# Patient Record
Sex: Male | Born: 1985 | Race: White | Hispanic: No | Marital: Single | Smoking: Never smoker
Health system: Southern US, Community
[De-identification: ages and names within clinical notes are randomized; demographics above are authoritative.]

## PROBLEM LIST (undated history)

## (undated) DIAGNOSIS — R569 Unspecified convulsions: Secondary | ICD-10-CM

## (undated) DIAGNOSIS — K219 Gastro-esophageal reflux disease without esophagitis: Secondary | ICD-10-CM

## (undated) DIAGNOSIS — K439 Ventral hernia without obstruction or gangrene: Secondary | ICD-10-CM

## (undated) HISTORY — PX: APPENDECTOMY: SHX54

---

## 2005-11-24 ENCOUNTER — Emergency Department: Payer: Self-pay | Admitting: Emergency Medicine

## 2007-04-03 ENCOUNTER — Ambulatory Visit: Payer: Self-pay | Admitting: Internal Medicine

## 2009-01-24 ENCOUNTER — Ambulatory Visit: Payer: Self-pay | Admitting: Family Medicine

## 2009-01-24 ENCOUNTER — Encounter (INDEPENDENT_AMBULATORY_CARE_PROVIDER_SITE_OTHER): Payer: Self-pay | Admitting: *Deleted

## 2009-04-07 ENCOUNTER — Ambulatory Visit: Payer: Self-pay | Admitting: Family Medicine

## 2009-05-18 ENCOUNTER — Inpatient Hospital Stay: Payer: Self-pay | Admitting: Vascular Surgery

## 2009-05-29 ENCOUNTER — Encounter: Payer: Self-pay | Admitting: Internal Medicine

## 2010-04-28 NOTE — Letter (Signed)
Summary: [facility]  [facility]   Imported By: Lanelle Bal 06/12/2009 08:42:26  _____________________________________________________________________  External Attachment:    Type:   Image     Comment:   External Document  Appended Document: [facility]    Clinical Lists Changes  Observations: Added new observation of PAST SURG HX: 1988 Bacterial meningitis 2/11 Laparoscopic appendectomy and drainage of abscess --Dr Wyn Quaker (06/12/2009 13:31)       Past History:  Past Surgical History: 1988 Bacterial meningitis 2/11 Laparoscopic appendectomy and drainage of abscess --Dr Wyn Quaker

## 2010-04-28 NOTE — Assessment & Plan Note (Signed)
Summary: SINUS INFECTION/[facility]   Vital Signs:  Patient profile:   25 year old male Weight:      187 pounds Temp:     97.5 degrees F oral Pulse rate:   72 / minute Pulse rhythm:   regular BP sitting:   110 / 80  (left arm) Cuff size:   regular  Vitals Entered By: Lowella Petties CMA (April 07, 2009 2:00 PM) CC: Congestion, sinus pressure   History of Present Illness: is sick with uri symptoms  started with a sore throat -- and thick feeling -- that was early this month  then developed nasal congestion and sinus pain  this am bad pressureon L side of face and around eye  ears are ok   cough- but not productive   no otc meds   missed work today-needs a note     Allergies: No Known Drug Allergies  Past History:  Past Medical History: Last updated: 04/03/2007 Absence seizures--off meds 2002 ADHD  Past Surgical History: Last updated: 04/03/2007 1988 Bacterial meningitis  Family History: Last updated: 04/03/2007 Doesn't know Dad Mom is health 1 brother, 1 sister CAD in mat GM Some HTN, DM in family  Social History: Last updated: 04/03/2007 Occupation: [employer] at [employer] Single Never Smoked Alcohol use-yes  Risk Factors: Smoking Status: never (04/03/2007)  Review of Systems General:  Complains of fatigue; denies chills, fever, loss of appetite, and malaise. Eyes:  Denies blurring and eye irritation. ENT:  Complains of hoarseness, nasal congestion, postnasal drainage, sinus pressure, and sore throat; denies ear discharge and earache. CV:  Denies chest pain or discomfort and palpitations. Resp:  Complains of cough; denies shortness of breath and wheezing. GI:  Denies abdominal pain, nausea, and vomiting. Derm:  Denies rash. Neuro:  Complains of headaches.  Physical Exam  General:  fatigued but well appearing  Head:  normocephalic, atraumatic, and no abnormalities observed.  sinus pain L maxillary- tender Eyes:  vision grossly intact, pupils  equal, pupils round, and pupils reactive to light.  mild conj injection Ears:  R ear normal and L ear normal.   Nose:  nares are congested and injected bilat  Mouth:  pharynx pink and moist, no erythema, and no exudates.   Neck:  No deformities, masses, or tenderness noted. Lungs:  Normal respiratory effort, chest expands symmetrically. Lungs are clear to auscultation, no crackles or wheezes. Heart:  Normal rate and regular rhythm. S1 and S2 normal without gallop, murmur, click, rub or other extra sounds. Skin:  Intact without suspicious lesions or rashes Cervical Nodes:  No lymphadenopathy noted Psych:  normal affect, talkative and pleasant    Impression & Recommendations:  Problem # 1:  SINUSITIS - ACUTE-NOS (ICD-461.9) Assessment New with congestion - for almost 2 weeks after uri will cover with augmentin and update  recommend sympt care- see pt instructions   pt advised to update me if symptoms worsen or do not improve - esp sinus pain or cough or any fever  His updated medication list for this problem includes:    Augmentin 875-125 Mg Tabs (Amoxicillin-pot clavulanate) .Marland Kitchen... 1 by mouth two times a day for 10 days for sinus infection  Complete Medication List: 1)  Augmentin 875-125 Mg Tabs (Amoxicillin-pot clavulanate) .Marland Kitchen.. 1 by mouth two times a day for 10 days for sinus infection  Patient Instructions: 1)  aleve ok for headache- take with food  2)  drink lots of water 3)  mucinex and nasal saline spray- can help with  congestion  4)  take the augmentin for sinus infection as directed  5)  if not improved in 1 week or worse- please update me  Prescriptions: AUGMENTIN 875-125 MG TABS (AMOXICILLIN-POT CLAVULANATE) 1 by mouth two times a day for 10 days for sinus infection  #20 x 0   Entered and Authorized by:   Judith Part MD   Signed by:   Judith Part MD on 04/07/2009   Method used:   Print then Give to Patient   RxID:   [id number]   Prior  Medications: Current Allergies: No known allergies

## 2010-04-28 NOTE — Letter (Signed)
Summary: Out of Work  [facility] at [facility]  [address] [place], [place] [address]   Phone: [phone]  Fax: [phone]    April 07, 2009   Employee:  TRACEY STEWART    To Whom It May Concern:   For Medical reasons, please excuse the above named employee from work for the following dates:  Start:   Apr 07, 2009   End:   Apr 09, 2009 if he is feeling better   If you need additional information, please feel free to contact our office.         Sincerely,    Judith Part MD

## 2010-05-15 ENCOUNTER — Ambulatory Visit (INDEPENDENT_AMBULATORY_CARE_PROVIDER_SITE_OTHER): Payer: 59 | Admitting: Family Medicine

## 2010-05-15 ENCOUNTER — Encounter (INDEPENDENT_AMBULATORY_CARE_PROVIDER_SITE_OTHER): Payer: Self-pay | Admitting: *Deleted

## 2010-05-15 ENCOUNTER — Encounter: Payer: Self-pay | Admitting: Family Medicine

## 2010-05-15 DIAGNOSIS — J029 Acute pharyngitis, unspecified: Secondary | ICD-10-CM

## 2010-05-15 DIAGNOSIS — A088 Other specified intestinal infections: Secondary | ICD-10-CM

## 2010-05-15 LAB — CONVERTED CEMR LAB: Rapid Strep: NEGATIVE

## 2010-05-20 NOTE — Assessment & Plan Note (Signed)
Summary: FEVER,ST,THROWING UP/CLE   [facility]   Vital Signs:  Patient profile:   25 year old male Weight:      172.25 pounds BMI:     23.45 Temp:     98.7 degrees F oral Pulse rate:   84 / minute Pulse rhythm:   regular BP sitting:   110 / 70  (left arm) Cuff size:   regular  Vitals Entered By: Selena Batten Dance CMA (AAMA) (May 15, 2010 10:10 AM) CC: Fever,sore throat,vomitting   History of Present Illness: CC: fever, ST, vomiting  1 d h/o low grade fever to 99.8, body aches, sweating.  Started when he came home from work yesterday.  Also vomiting since yesterday.  No diarrhea.  ST started today (after vomiting).  Emesis NBNB.  No sick contacts that patient knows.  No new foods.  Mild headache, thinks staying well hydrated.    Parents smoke, pt denies EtOH, no rec drugs.  h/o bacterial meningitis as child.  no current neck stiffnes.  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  Eyes:  vision grossly intact, pupils equal, pupils round, and pupils reactive to light.  Ears:  R ear normal and L ear normal.   Nose:  nares clear bilaterally Mouth:  pharynx pink and moist  + erythema, some thin white exudate L tonsil Neck:  No deformities, masses, or tenderness noted.  no LAD Lungs:  Normal respiratory effort, chest expands symmetrically. Lungs are clear to auscultation, no crackles or wheezes. Heart:  Normal rate and  regular rhythm. S1 and S2 normal without gallop, murmur, click, rub or other extra sounds. Abdomen:  Bowel sounds positive,abdomen soft and non-tender without masses, organomegaly or hernias noted. Pulses:  2+ rad pulses, brisk cap refill Extremities:  no pedal edema Skin:  Intact without suspicious lesions or rashes   Impression & Recommendations:  Problem # 1:  VIRAL GASTROENTERITIS (ICD-008.8) Assessment New in setting of stomach flu going around.  seems to be improving alone.  discused anticipated course of recovery.  given ST, checked rapid strep - negative.  pt declines antinausea med.  advised update [place] if not improving as expected.  supportive care, push fluids.  Problem # 2:  SORE THROAT (ICD-462) likely due to vomiting last night. The following medications were removed from the medication list:    Augmentin 875-125 Mg Tabs (Amoxicillin-pot clavulanate) .Marland Kitchen... 1 by mouth two times a day for 10 days for sinus infection  Orders: Rapid Strep ([address])  Patient Instructions: 1)  Swabbed your throat today to ensure not strep. 2)  Sounds like viral gastroenteritis.  treat with pushing fluids and plenty of rest.   3)  Tyelnol better than ibuprofen/motrin while you're feeling poorly.   4)  If fever >101.5 or not improving as expected (in 24 -48 hours), let us know.  5)  If feeling worse, please get checked out over weekend. 6)  Good to see you today.   Orders Added: 1)  Rapid Strep [87880] 2)  Est. Patient Level III [04540]    Prior Medications: Current Allergies (reviewed today): No known allergies   Laboratory Results  Date/Time Received: May 15, 2010 11:13 AM  Date/Time Reported: May 15, 2010 11:14 AM   Other Tests  Rapid Strep: negative

## 2010-05-20 NOTE — Letter (Signed)
Summary: Out of Work  [facility] at [facility]  [address] [place], [place] [address]   Phone: [phone]  Fax: [phone]    May 15, 2010   Employee:  DMAURI ROSENOW    To Whom It May Concern:   For Medical reasons, please excuse the above named employee from work for the following dates:  Start:  May 15, 2010   End:  May 16, 2010 (May return on the 19th)  If you need additional information, please feel free to contact our office.           Sincerely,      Selena Batten Dance CMA (AAMA)

## 2018-11-07 ENCOUNTER — Inpatient Hospital Stay
Admission: EM | Admit: 2018-11-07 | Discharge: 2018-11-10 | DRG: 419 | Disposition: A | Discharge status: Home / Self Care | Payer: Self-pay | Attending: Surgery | Admitting: Surgery

## 2018-11-07 ENCOUNTER — Encounter: Payer: Self-pay | Admitting: Emergency Medicine

## 2018-11-07 ENCOUNTER — Other Ambulatory Visit: Payer: Self-pay

## 2018-11-07 ENCOUNTER — Emergency Department: Payer: Self-pay

## 2018-11-07 DIAGNOSIS — K660 Peritoneal adhesions (postprocedural) (postinfection): Secondary | ICD-10-CM | POA: Diagnosis present

## 2018-11-07 DIAGNOSIS — K8000 Calculus of gallbladder with acute cholecystitis without obstruction: Principal | ICD-10-CM | POA: Diagnosis present

## 2018-11-07 DIAGNOSIS — Z20828 Contact with and (suspected) exposure to other viral communicable diseases: Secondary | ICD-10-CM | POA: Diagnosis present

## 2018-11-07 DIAGNOSIS — K82A1 Gangrene of gallbladder in cholecystitis: Secondary | ICD-10-CM | POA: Diagnosis present

## 2018-11-07 DIAGNOSIS — Z6833 Body mass index (BMI) 33.0-33.9, adult: Secondary | ICD-10-CM

## 2018-11-07 DIAGNOSIS — R1011 Right upper quadrant pain: Secondary | ICD-10-CM

## 2018-11-07 DIAGNOSIS — K810 Acute cholecystitis: Secondary | ICD-10-CM | POA: Diagnosis present

## 2018-11-07 DIAGNOSIS — K828 Other specified diseases of gallbladder: Secondary | ICD-10-CM | POA: Diagnosis present

## 2018-11-07 DIAGNOSIS — K219 Gastro-esophageal reflux disease without esophagitis: Secondary | ICD-10-CM | POA: Diagnosis present

## 2018-11-07 HISTORY — DX: Gastro-esophageal reflux disease without esophagitis: K21.9

## 2018-11-07 LAB — COMPREHENSIVE METABOLIC PANEL
ALT: 136 U/L — ABNORMAL HIGH (ref 0–44)
AST: 59 U/L — ABNORMAL HIGH (ref 15–41)
Albumin: 4.6 g/dL (ref 3.5–5.0)
Alkaline Phosphatase: 95 U/L (ref 38–126)
Anion gap: 10 (ref 5–15)
BUN: 12 mg/dL (ref 6–20)
CO2: 24 mmol/L (ref 22–32)
Calcium: 9.2 mg/dL (ref 8.9–10.3)
Chloride: 101 mmol/L (ref 98–111)
Creatinine, Ser: 0.98 mg/dL (ref 0.61–1.24)
GFR calc Af Amer: >60 mL/min (ref >60)
GFR calc non Af Amer: >60 mL/min (ref >60)
Glucose, Bld: 110 mg/dL — ABNORMAL HIGH (ref 70–99)
Potassium: 3.7 mmol/L (ref 3.5–5.1)
Sodium: 135 mmol/L (ref 135–145)
Total Bilirubin: 1.3 mg/dL — ABNORMAL HIGH (ref 0.3–1.2)
Total Protein: 8.4 g/dL — ABNORMAL HIGH (ref 6.5–8.1)

## 2018-11-07 LAB — LIPASE, BLOOD: Lipase: 25 U/L (ref 11–51)

## 2018-11-07 LAB — CBC
HCT: 46.5 % (ref 39.0–52.0)
Hemoglobin: 16.3 g/dL (ref 13.0–17.0)
MCH: 30.1 pg (ref 26.0–34.0)
MCHC: 35.1 g/dL (ref 30.0–36.0)
MCV: 85.8 fL (ref 80.0–100.0)
Platelets: 317 10*3/uL (ref 150–400)
RBC: 5.42 MIL/uL (ref 4.22–5.81)
RDW: 13.4 % (ref 11.5–15.5)
WBC: 12.6 10*3/uL — ABNORMAL HIGH (ref 4.0–10.5)
nRBC: 0 % (ref 0.0–0.2)

## 2018-11-07 LAB — SARS CORONAVIRUS 2 BY RT PCR (HOSPITAL ORDER, PERFORMED IN ~~LOC~~ HOSPITAL LAB): SARS Coronavirus 2: NEGATIVE

## 2018-11-07 MED ORDER — SODIUM CHLORIDE 0.9 % IV BOLUS
1000.0000 mL | Freq: Once | INTRAVENOUS | Status: AC
  Administered 2018-11-07: 15:00:00 1000 mL via INTRAVENOUS

## 2018-11-07 MED ORDER — LACTATED RINGERS IV SOLN
INTRAVENOUS | Status: DC
  Administered 2018-11-07 – 2018-11-09 (×5): via INTRAVENOUS

## 2018-11-07 MED ORDER — ONDANSETRON HCL 4 MG/2ML IJ SOLN: 4.0000 mg | Freq: Four times a day (QID) | INTRAMUSCULAR | Status: DC | PRN

## 2018-11-07 MED ORDER — ONDANSETRON HCL 4 MG/2ML IJ SOLN: 4.0000 mg | Freq: Once | INTRAMUSCULAR | Status: DC

## 2018-11-07 MED ORDER — ONDANSETRON 4 MG PO TBDP: 4.0000 mg | ORAL_TABLET | Freq: Four times a day (QID) | ORAL | Status: DC | PRN

## 2018-11-07 MED ORDER — LIDOCAINE VISCOUS HCL 2 % MT SOLN
15.0000 mL | Freq: Once | OROMUCOSAL | Status: AC
  Administered 2018-11-07: 15 mL via ORAL
  Filled 2018-11-07: qty 15

## 2018-11-07 MED ORDER — SODIUM CHLORIDE 0.9 % IV SOLN
2.0000 g | INTRAVENOUS | Status: DC
  Administered 2018-11-07 – 2018-11-09 (×3): 2 g via INTRAVENOUS
  Filled 2018-11-07 (×2): qty 20
  Filled 2018-11-07 (×3): qty 2

## 2018-11-07 MED ORDER — MORPHINE SULFATE (PF) 2 MG/ML IV SOLN
2.0000 mg | INTRAVENOUS | Status: DC | PRN
  Administered 2018-11-08: 15:00:00 2 mg via INTRAVENOUS
  Filled 2018-11-07: qty 1

## 2018-11-07 MED ORDER — SODIUM CHLORIDE 0.9 % IV SOLN
INTRAVENOUS | Status: DC | PRN
  Administered 2018-11-07: 21:00:00 250 mL via INTRAVENOUS

## 2018-11-07 MED ORDER — TRAMADOL HCL 50 MG PO TABS
50.0000 mg | ORAL_TABLET | Freq: Four times a day (QID) | ORAL | Status: DC | PRN
  Administered 2018-11-10: 14:00:00 50 mg via ORAL
  Filled 2018-11-07: qty 1

## 2018-11-07 MED ORDER — DOCUSATE SODIUM 100 MG PO CAPS: 100.0000 mg | ORAL_CAPSULE | Freq: Two times a day (BID) | ORAL | Status: DC | PRN

## 2018-11-07 MED ORDER — ONDANSETRON HCL 4 MG/2ML IJ SOLN
4.0000 mg | Freq: Once | INTRAMUSCULAR | Status: AC
  Administered 2018-11-07: 15:00:00 4 mg via INTRAVENOUS
  Filled 2018-11-07: qty 2

## 2018-11-07 MED ORDER — MORPHINE SULFATE (PF) 2 MG/ML IV SOLN
2.0000 mg | Freq: Once | INTRAVENOUS | Status: AC
  Administered 2018-11-07: 15:00:00 2 mg via INTRAVENOUS
  Filled 2018-11-07: qty 1

## 2018-11-07 MED ORDER — HYDROCODONE-ACETAMINOPHEN 5-325 MG PO TABS
1.0000 | ORAL_TABLET | ORAL | Status: DC | PRN
  Administered 2018-11-07: 20:00:00 2 via ORAL
  Administered 2018-11-09: 17:00:00 1 via ORAL
  Administered 2018-11-10 (×3): 2 via ORAL
  Filled 2018-11-07 (×4): qty 2
  Filled 2018-11-07: qty 1

## 2018-11-07 MED ORDER — ALUM & MAG HYDROXIDE-SIMETH 200-200-20 MG/5ML PO SUSP
30.0000 mL | Freq: Once | ORAL | Status: AC
  Administered 2018-11-07: 30 mL via ORAL
  Filled 2018-11-07: qty 30

## 2018-11-07 NOTE — ED Triage Notes (Signed)
Pt sent over from [facility], reports epigastric pain x 2 days w/ N/V, denies diarrhea.  Hx GERD.  NAD noted at this time.

## 2018-11-07 NOTE — ED Notes (Signed)
Floor made aware that pt was on the way

## 2018-11-07 NOTE — ED Provider Notes (Signed)
[facility] Emergency Department Provider Note  ____________________________________________   First MD Initiated Contact with Patient 11/07/18 1753     (approximate)  I have reviewed the triage vital signs and the nursing notes.   HISTORY  Chief Complaint Abdominal Pain    HPI Kristopher Allen is a 33 y.o. male with previous history of gastroesophageal reflux disease presents to the emergency department secondary to intermittent epigastric/upper abdominal pain x2 days with associated nausea.  Patient denies any fever.  Patient denies any diarrhea constipation.  Patient denies any urinary symptoms.       Past Medical History:  Diagnosis Date  . GERD (gastroesophageal reflux disease)       Past Surgical History:  Procedure Laterality Date  . APPENDECTOMY      Prior to Admission medications   Not on File    Allergies Patient has no known allergies.  No family history on file.  Social History Social History   Tobacco Use  . Smoking status: Never Smoker  . Smokeless tobacco: Never Used  Substance Use Topics  . Alcohol use: Never    Frequency: Never  . Drug use: Never    Review of Systems Constitutional: No fever/chills Eyes: No visual changes. ENT: No sore throat. Cardiovascular: Denies chest pain. Respiratory: Denies shortness of breath. Gastrointestinal: Positive for abdominal pain.  No nausea, no vomiting.  No diarrhea.  No constipation. Genitourinary: Negative for dysuria. Musculoskeletal: Negative for neck pain.  Negative for back pain. Integumentary: Negative for rash. Neurological: Negative for headaches, focal weakness or numbness.   ____________________________________________   PHYSICAL EXAM:  VITAL SIGNS: ED Triage Vitals  Enc Vitals Group     BP 11/07/18 1419 118/73     Pulse Rate 11/07/18 1419 85     Resp 11/07/18 1419 20     Temp 11/07/18 1419 98.7 F (37.1 C)     Temp Source 11/07/18 1419 Oral     SpO2 11/07/18 1419 97 %     Weight 11/07/18 1419 113.4 kg (250 lb)     Height 11/07/18 1419 1.829 m (6')     Head Circumference --      Peak Flow --      Pain Score 11/07/18 1430 4     Pain Loc --      Pain Edu? --      Excl. in GC? --     Constitutional: Alert and oriented.  Eyes: Conjunctivae are normal.  Mouth/Throat: Mucous membranes are moist. Neck: No stridor.  No meningeal signs.   Cardiovascular: Normal rate, regular rhythm. Good peripheral circulation. Grossly normal heart sounds. Respiratory: Normal respiratory effort.  No retractions. Gastrointestinal: Right upper quadrant tenderness to palpation. No distention.  Musculoskeletal: No lower extremity tenderness nor edema. No gross deformities of extremities. Neurologic:  Normal speech and language. No gross focal neurologic deficits are appreciated.  Skin:  Skin is warm, dry and intact. Psychiatric: Mood and affect are normal. Speech and behavior are normal.  ____________________________________________   LABS (all labs ordered are listed, but only abnormal results are displayed)  Labs Reviewed  COMPREHENSIVE METABOLIC PANEL - Abnormal; Notable for the following components:      Result Value   Glucose, Bld 110 (*)    Total Protein 8.4 (*)    AST 59 (*)    ALT 136 (*)    Total Bilirubin 1.3 (*)    All other components within normal limits  CBC - Abnormal; Notable for the following components:  WBC 12.6 (*)    All other components within normal limits  SARS CORONAVIRUS 2 (HOSPITAL ORDER, [place] [facility])  SURGICAL PCR SCREEN  LIPASE, BLOOD  URINALYSIS, COMPLETE (UACMP) WITH MICROSCOPIC  HIV ANTIBODY (ROUTINE TESTING W REFLEX)  BASIC METABOLIC PANEL  MAGNESIUM  PHOSPHORUS  CBC  HEPATIC FUNCTION PANEL   ____________________________________________  EKG  ED ECG REPORT I, [place] N Nashalie Sallis, the attending physician, personally viewed and interpreted this ECG.   Date: 11/07/2018  EKG  Time: 2:17 PM  Rate: 74  Rhythm: Normal sinus rhythm  Axis: Normal  Intervals: Normal  ST&T Change: None  ____________________________________________  RADIOLOGY I, Monserrate N Briar Sword, personally viewed and evaluated these images (plain radiographs) as part of my medical decision making, as well as reviewing the written report by the radiologist.  ED MD interpretation: Cholelithiasis with gallbladder wall thickening pericholecystic fluid concerning for acute cholecystitis.  Official radiology report(s): US Abdomen Limited Ruq  Result Date: 11/07/2018 CLINICAL DATA:  Upper abdominal pain EXAM: ULTRASOUND ABDOMEN LIMITED RIGHT UPPER QUADRANT COMPARISON:  None. FINDINGS: Gallbladder: Within the gallbladder, there is either one large or several smaller adherent gallstones measuring 2.7 cm in length. There is also sludge in the gallbladder. The gallbladder wall is thickened and slightly edematous in appearance with mild pericholecystic fluid. No sonographic Murphy sign noted by sonographer. Common bile duct: Diameter: 4 mm. No intrahepatic or extrahepatic biliary duct dilatation. Liver: No focal lesion identified. Liver echogenicity is increased diffusely. Portal vein is patent on color Doppler imaging with normal direction of blood flow towards the liver. Other: None. IMPRESSION: 1. Cholelithiasis with gallbladder wall thickening and pericholecystic fluid. These are findings felt to be indicative of acute cholecystitis. 2. Diffuse increase in liver echogenicity, a finding felt to be indicative of hepatic steatosis. While no focal liver lesions are evident on this study, it must be cautioned that the sensitivity of ultrasound for detection of focal liver lesions is diminished in this circumstance. Electronically Signed   By: Lowella Grip III M.D.   On: 11/07/2018 16:19    Procedures   ____________________________________________   INITIAL IMPRESSION / MDM / ASSESSMENT AND PLAN / ED COURSE   As part of my medical decision making, I reviewed the following data within the electronic MEDICAL RECORD NUMBER  33 year old male presented with above-stated history and physical exam concerning for cholelithiasis/cholecystitis and as such right upper quadrant ultrasound performed which revealed evidence of cholecystitis and cholelithiasis.  Patient was given IV morphine in the emergency department improvement of pain.  Patient's laboratory data notable for bilirubin of 1.3 AST and ALT 59 and 136 respectively.  Patient discussed with Dr. Lysle Pearl general surgery who admitted the patient for further evaluation and management.       ____________________________________________  FINAL CLINICAL IMPRESSION(S) / ED DIAGNOSES  Final diagnoses:  RUQ pain  Acute cholecystitis     MEDICATIONS GIVEN DURING THIS VISIT:  Medications  traMADol (ULTRAM) tablet 50 mg (has no administration in time range)  HYDROcodone-acetaminophen (NORCO/VICODIN) 5-325 MG per tablet 1-2 tablet (has no administration in time range)  morphine 2 MG/ML injection 2 mg (has no administration in time range)  docusate sodium (COLACE) capsule 100 mg (has no administration in time range)  ondansetron (ZOFRAN-ODT) disintegrating tablet 4 mg (has no administration in time range)    Or  ondansetron (ZOFRAN) injection 4 mg (has no administration in time range)  cefTRIAXone (ROCEPHIN) 2 g in sodium chloride 0.9 % 100 mL IVPB (has no administration  in time range)  lactated ringers infusion (has no administration in time range)  ondansetron (ZOFRAN) injection 4 mg (4 mg Intravenous Given 11/07/18 1525)  sodium chloride 0.9 % bolus 1,000 mL (0 mLs Intravenous Stopped 11/07/18 1738)  morphine 2 MG/ML injection 2 mg (2 mg Intravenous Given 11/07/18 1528)  alum & mag hydroxide-simeth (MAALOX/MYLANTA) 200-200-20 MG/5ML suspension 30 mL (30 mLs Oral Given 11/07/18 1532)    And  lidocaine (XYLOCAINE) 2 % viscous mouth solution 15 mL (15 mLs Oral  Given 11/07/18 1532)     ED Discharge Orders    None      *Please note:  Kristopher Allen was evaluated in Emergency Department on 11/07/2018 for the symptoms described in the history of present illness. He was evaluated in the context of the global COVID-19 pandemic, which necessitated consideration that the patient might be at risk for infection with the SARS-CoV-2 virus that causes COVID-19. Institutional protocols and algorithms that pertain to the evaluation of patients at risk for COVID-19 are in a state of rapid change based on information released by regulatory bodies including the CDC and federal and state organizations. These policies and algorithms were followed during the patient's care in the ED.  Some ED evaluations and interventions may be delayed as a result of limited staffing during the pandemic.*  Note:  This document was prepared using Dragon voice recognition software and may include unintentional dictation errors.   Darci CurrentBrown, [place] N, MD 11/07/18 2023

## 2018-11-07 NOTE — ED Notes (Signed)
Sent green and purple top tubes to lab. 

## 2018-11-07 NOTE — ED Notes (Signed)
First Nurse Note; Pt brought over via [facility] due to intermittent chest and abdominal pain x 2 days.  NAD noted at this time.

## 2018-11-07 NOTE — H&P (Signed)
Subjective:   CC: Acute cholecystitis  HPI:  Kristopher Allen is a 33 y.o. male who is consulted by Manson PasseyBrown for evaluation of above cc.  Symptoms were first noted 2 days ago. Pain is crampy, intermittent, located in RUQ.  Associated with N/V, exacerbated by nothing specfiic     Past Medical History:  has a past medical history of GERD (gastroesophageal reflux disease).  Past Surgical History:  has a past surgical history that includes Appendectomy.  Family History: Reviewed and not relevant to chief complaint  Social History:  reports that he has never smoked. He has never used smokeless tobacco. He reports that he does not drink alcohol or use drugs.  Current Medications: None reported  Allergies:  Allergies as of 11/07/2018  . (No Known Allergies)    ROS:  General: Denies weight loss, weight gain, fatigue, fevers, chills, and night sweats. Eyes: Denies blurry vision, double vision, eye pain, itchy eyes, and tearing. Ears: Denies hearing loss, earache, and ringing in ears. Nose: Denies sinus pain, congestion, infections, runny nose, and nosebleeds. Mouth/throat: Denies hoarseness, sore throat, bleeding gums, and difficulty swallowing. Heart: Denies chest pain, palpitations, racing heart, irregular heartbeat, leg pain or swelling, and decreased activity tolerance. Respiratory: Denies breathing difficulty, shortness of breath, wheezing, cough, and sputum. GI: Denies  constipation, diarrhea, and blood in stool. GU: Denies difficulty urinating, pain with urinating, urgency, frequency, blood in urine. Musculoskeletal: Denies joint stiffness, pain, swelling, muscle weakness. Skin: Denies rash, itching, mass, tumors, sores, and boils Neurologic: Denies headache, fainting, dizziness, seizures, numbness, and tingling. Psychiatric: Denies depression, anxiety, difficulty sleeping, and memory loss. Endocrine: Denies heat or cold intolerance, and increased thirst or urination. Blood/lymph:  Denies easy bruising, easy bruising, and swollen glands     Objective:     BP 125/67 (BP Location: Left Arm)   Pulse 79   Temp 98.7 F (37.1 C) (Oral)   Resp 17   Ht 6' (1.829 m)   Wt 113.4 kg   SpO2 98%   BMI 33.91 kg/m    Constitutional :  alert, cooperative, appears stated age and no distress  Lymphatics/Throat:  no asymmetry, masses, or scars  Respiratory:  clear to auscultation bilaterally  Cardiovascular:  regular rate and rhythm  Gastrointestinal: soft, non-tender; bowel sounds normal; no masses,  no organomegaly.   Musculoskeletal: Steady movement  Skin: Cool and moist  Psychiatric: Normal affect, non-agitated, not confused       LABS:  No flowsheet data found. CBC Latest Ref Rng & Units 11/07/2018  WBC 4.0 - 10.5 K/uL 12.6(H)  Hemoglobin 13.0 - 17.0 g/dL 16.116.3  Hematocrit 09.639.0 - 52.0 % 46.5  Platelets 150 - 400 K/uL 317   CMP, lipase- pending  RADS: CLINICAL DATA:  Upper abdominal pain  EXAM: ULTRASOUND ABDOMEN LIMITED RIGHT UPPER QUADRANT  COMPARISON:  None.  FINDINGS: Gallbladder:  Within the gallbladder, there is either one large or several smaller adherent gallstones measuring 2.7 cm in length. There is also sludge in the gallbladder. The gallbladder wall is thickened and slightly edematous in appearance with mild pericholecystic fluid. No sonographic Murphy sign noted by sonographer.  Common bile duct:  Diameter: 4 mm. No intrahepatic or extrahepatic biliary duct dilatation.  Liver:  No focal lesion identified. Liver echogenicity is increased diffusely. Portal vein is patent on color Doppler imaging with normal direction of blood flow towards the liver.  Other: None.  IMPRESSION: 1. Cholelithiasis with gallbladder wall thickening and pericholecystic fluid. These are findings felt to be  indicative of acute cholecystitis.  2. Diffuse increase in liver echogenicity, a finding felt to be indicative of hepatic steatosis.  While no focal liver lesions are evident on this study, it must be cautioned that the sensitivity of ultrasound for detection of focal liver lesions is diminished in this circumstance.   Electronically Signed   By: Lowella Grip III M.D.   On: 11/07/2018 16:19 Assessment:      Acute cholecystitis, based on history and physical as well as ultrasound results.  Pending CMP to ensure no choledocholithiasis and/or pancreatitis.  Plan:      Discussed the risk of surgery including post-op infxn, seroma, biloma, chronic pain, poor-delayed wound healing, retained gallstone, conversion to open procedure, post-op SBO or ileus, and need for additional procedures to address said risks.  The risks of general anesthetic including MI, CVA, sudden death or even reaction to anesthetic medications also discussed. Alternatives include continued observation.  Benefits include possible symptom relief, prevention of complications including acute cholecystitis, pancreatitis.  Typical post operative recovery of 3-5 days rest, continued pain in area and incision sites, possible loose stools up to 4-6 weeks, also discussed.  The patient understands the risks, any and all questions were answered to the patient's satisfaction.  IV fluids, antibiotics, awaiting labs.  If labs within normal limits, likely will proceed with lap chole tomorrow.  Patient and mother verbalized understanding.

## 2018-11-08 LAB — BASIC METABOLIC PANEL
Anion gap: 7 (ref 5–15)
BUN: 13 mg/dL (ref 6–20)
CO2: 27 mmol/L (ref 22–32)
Calcium: 8.6 mg/dL — ABNORMAL LOW (ref 8.9–10.3)
Chloride: 102 mmol/L (ref 98–111)
Creatinine, Ser: 0.92 mg/dL (ref 0.61–1.24)
GFR calc Af Amer: >60 mL/min (ref >60)
GFR calc non Af Amer: >60 mL/min (ref >60)
Glucose, Bld: 86 mg/dL (ref 70–99)
Potassium: 3.7 mmol/L (ref 3.5–5.1)
Sodium: 136 mmol/L (ref 135–145)

## 2018-11-08 LAB — HEPATIC FUNCTION PANEL
ALT: 123 U/L — ABNORMAL HIGH (ref 0–44)
AST: 61 U/L — ABNORMAL HIGH (ref 15–41)
Albumin: 4 g/dL (ref 3.5–5.0)
Alkaline Phosphatase: 89 U/L (ref 38–126)
Bilirubin, Direct: 0.3 mg/dL — ABNORMAL HIGH (ref 0.0–0.2)
Indirect Bilirubin: 0.7 mg/dL (ref 0.3–0.9)
Total Bilirubin: 1 mg/dL (ref 0.3–1.2)
Total Protein: 7.4 g/dL (ref 6.5–8.1)

## 2018-11-08 LAB — CBC
HCT: 44.9 % (ref 39.0–52.0)
Hemoglobin: 15.3 g/dL (ref 13.0–17.0)
MCH: 30.1 pg (ref 26.0–34.0)
MCHC: 34.1 g/dL (ref 30.0–36.0)
MCV: 88.2 fL (ref 80.0–100.0)
Platelets: 240 10*3/uL (ref 150–400)
RBC: 5.09 MIL/uL (ref 4.22–5.81)
RDW: 13.2 % (ref 11.5–15.5)
WBC: 9.5 10*3/uL (ref 4.0–10.5)
nRBC: 0 % (ref 0.0–0.2)

## 2018-11-08 LAB — SURGICAL PCR SCREEN
MRSA, PCR: NEGATIVE
Staphylococcus aureus: POSITIVE — AB

## 2018-11-08 LAB — MAGNESIUM: Magnesium: 2 mg/dL (ref 1.7–2.4)

## 2018-11-08 LAB — PHOSPHORUS: Phosphorus: 3.2 mg/dL (ref 2.5–4.6)

## 2018-11-08 MED ORDER — MUPIROCIN 2 % EX OINT
1.0000 "application " | TOPICAL_OINTMENT | Freq: Two times a day (BID) | CUTANEOUS | Status: DC
  Administered 2018-11-08 – 2018-11-09 (×2): 1 via NASAL
  Filled 2018-11-08: qty 22

## 2018-11-08 MED ORDER — CHLORHEXIDINE GLUCONATE CLOTH 2 % EX PADS
6.0000 | MEDICATED_PAD | Freq: Every day | CUTANEOUS | Status: DC
  Administered 2018-11-08: 21:00:00 6 via TOPICAL

## 2018-11-09 ENCOUNTER — Inpatient Hospital Stay: Payer: Self-pay | Admitting: Anesthesiology

## 2018-11-09 ENCOUNTER — Encounter: Payer: Self-pay | Admitting: Surgery

## 2018-11-09 ENCOUNTER — Encounter
Admission: EM | Disposition: A | Discharge status: Home / Self Care | Payer: Self-pay | Source: Home / Self Care | Attending: Surgery

## 2018-11-09 HISTORY — PX: CHOLECYSTECTOMY: SHX55

## 2018-11-09 LAB — HEPATIC FUNCTION PANEL
ALT: 122 U/L — ABNORMAL HIGH (ref 0–44)
AST: 63 U/L — ABNORMAL HIGH (ref 15–41)
Albumin: 3.7 g/dL (ref 3.5–5.0)
Alkaline Phosphatase: 101 U/L (ref 38–126)
Bilirubin, Direct: 0.2 mg/dL (ref 0.0–0.2)
Indirect Bilirubin: 0.7 mg/dL (ref 0.3–0.9)
Total Bilirubin: 0.9 mg/dL (ref 0.3–1.2)
Total Protein: 7.3 g/dL (ref 6.5–8.1)

## 2018-11-09 LAB — MAGNESIUM: Magnesium: 2 mg/dL (ref 1.7–2.4)

## 2018-11-09 LAB — PHOSPHORUS: Phosphorus: 3.4 mg/dL (ref 2.5–4.6)

## 2018-11-09 LAB — BASIC METABOLIC PANEL
Anion gap: 9 (ref 5–15)
BUN: 12 mg/dL (ref 6–20)
CO2: 26 mmol/L (ref 22–32)
Calcium: 8.8 mg/dL — ABNORMAL LOW (ref 8.9–10.3)
Chloride: 101 mmol/L (ref 98–111)
Creatinine, Ser: 0.92 mg/dL (ref 0.61–1.24)
GFR calc Af Amer: >60 mL/min (ref >60)
GFR calc non Af Amer: >60 mL/min (ref >60)
Glucose, Bld: 88 mg/dL (ref 70–99)
Potassium: 4.1 mmol/L (ref 3.5–5.1)
Sodium: 136 mmol/L (ref 135–145)

## 2018-11-09 LAB — CBC
HCT: 43 % (ref 39.0–52.0)
Hemoglobin: 15 g/dL (ref 13.0–17.0)
MCH: 30 pg (ref 26.0–34.0)
MCHC: 34.9 g/dL (ref 30.0–36.0)
MCV: 86 fL (ref 80.0–100.0)
Platelets: 243 10*3/uL (ref 150–400)
RBC: 5 MIL/uL (ref 4.22–5.81)
RDW: 13 % (ref 11.5–15.5)
WBC: 8 10*3/uL (ref 4.0–10.5)
nRBC: 0 % (ref 0.0–0.2)

## 2018-11-09 LAB — HIV ANTIBODY (ROUTINE TESTING W REFLEX): HIV Screen 4th Generation wRfx: NONREACTIVE

## 2018-11-09 SURGERY — LAPAROSCOPIC CHOLECYSTECTOMY
Anesthesia: General | Site: Abdomen

## 2018-11-09 MED ORDER — FENTANYL CITRATE (PF) 100 MCG/2ML IJ SOLN
INTRAMUSCULAR | Status: AC
  Filled 2018-11-09: qty 2

## 2018-11-09 MED ORDER — MIDAZOLAM HCL 2 MG/2ML IJ SOLN
INTRAMUSCULAR | Status: AC
  Filled 2018-11-09: qty 2

## 2018-11-09 MED ORDER — SUGAMMADEX SODIUM 200 MG/2ML IV SOLN
INTRAVENOUS | Status: AC
  Filled 2018-11-09: qty 2

## 2018-11-09 MED ORDER — MEPERIDINE HCL 50 MG/ML IJ SOLN: 6.2500 mg | INTRAMUSCULAR | Status: DC | PRN

## 2018-11-09 MED ORDER — ENOXAPARIN SODIUM 40 MG/0.4ML ~~LOC~~ SOLN
40.0000 mg | SUBCUTANEOUS | Status: DC
  Filled 2018-11-09: qty 0.4

## 2018-11-09 MED ORDER — SUGAMMADEX SODIUM 200 MG/2ML IV SOLN
INTRAVENOUS | Status: DC | PRN
  Administered 2018-11-09: 300 mg via INTRAVENOUS

## 2018-11-09 MED ORDER — ROCURONIUM BROMIDE 50 MG/5ML IV SOLN
INTRAVENOUS | Status: AC
  Filled 2018-11-09: qty 1

## 2018-11-09 MED ORDER — LIDOCAINE HCL (CARDIAC) PF 100 MG/5ML IV SOSY
PREFILLED_SYRINGE | INTRAVENOUS | Status: DC | PRN
  Administered 2018-11-09: 100 mg via INTRAVENOUS

## 2018-11-09 MED ORDER — ROCURONIUM BROMIDE 100 MG/10ML IV SOLN
INTRAVENOUS | Status: DC | PRN
  Administered 2018-11-09: 20 mg via INTRAVENOUS
  Administered 2018-11-09: 50 mg via INTRAVENOUS
  Administered 2018-11-09 (×3): 10 mg via INTRAVENOUS

## 2018-11-09 MED ORDER — SUCCINYLCHOLINE CHLORIDE 20 MG/ML IJ SOLN
INTRAMUSCULAR | Status: AC
  Filled 2018-11-09: qty 1

## 2018-11-09 MED ORDER — FENTANYL CITRATE (PF) 100 MCG/2ML IJ SOLN
INTRAMUSCULAR | Status: AC
  Administered 2018-11-09: 11:00:00 25 ug via INTRAVENOUS
  Filled 2018-11-09: qty 2

## 2018-11-09 MED ORDER — MIDAZOLAM HCL 2 MG/2ML IJ SOLN
INTRAMUSCULAR | Status: DC | PRN
  Administered 2018-11-09: 2 mg via INTRAVENOUS

## 2018-11-09 MED ORDER — ACETAMINOPHEN 160 MG/5ML PO SOLN
325.0000 mg | ORAL | Status: DC | PRN
  Filled 2018-11-09: qty 20.3

## 2018-11-09 MED ORDER — FENTANYL CITRATE (PF) 100 MCG/2ML IJ SOLN
INTRAMUSCULAR | Status: DC | PRN
  Administered 2018-11-09 (×5): 50 ug via INTRAVENOUS

## 2018-11-09 MED ORDER — DEXAMETHASONE SODIUM PHOSPHATE 10 MG/ML IJ SOLN
INTRAMUSCULAR | Status: AC
  Filled 2018-11-09: qty 1

## 2018-11-09 MED ORDER — PROMETHAZINE HCL 25 MG/ML IJ SOLN: 6.2500 mg | INTRAMUSCULAR | Status: DC | PRN

## 2018-11-09 MED ORDER — ACETAMINOPHEN 10 MG/ML IV SOLN
INTRAVENOUS | Status: DC | PRN
  Administered 2018-11-09: 1000 mg via INTRAVENOUS

## 2018-11-09 MED ORDER — ONDANSETRON HCL 4 MG/2ML IJ SOLN
INTRAMUSCULAR | Status: DC | PRN
  Administered 2018-11-09: 4 mg via INTRAVENOUS

## 2018-11-09 MED ORDER — LIDOCAINE-EPINEPHRINE (PF) 1 %-1:200000 IJ SOLN
INTRAMUSCULAR | Status: DC | PRN
  Administered 2018-11-09: 20 mL via INTRADERMAL

## 2018-11-09 MED ORDER — ACETAMINOPHEN 10 MG/ML IV SOLN
INTRAVENOUS | Status: AC
  Filled 2018-11-09: qty 100

## 2018-11-09 MED ORDER — FENTANYL CITRATE (PF) 100 MCG/2ML IJ SOLN
25.0000 ug | INTRAMUSCULAR | Status: DC | PRN
  Administered 2018-11-09: 11:00:00 25 ug via INTRAVENOUS

## 2018-11-09 MED ORDER — ACETAMINOPHEN 325 MG PO TABS: 325.0000 mg | ORAL_TABLET | ORAL | Status: DC | PRN

## 2018-11-09 MED ORDER — DEXAMETHASONE SODIUM PHOSPHATE 10 MG/ML IJ SOLN
INTRAMUSCULAR | Status: DC | PRN
  Administered 2018-11-09: 10 mg via INTRAVENOUS

## 2018-11-09 MED ORDER — PROPOFOL 10 MG/ML IV BOLUS
INTRAVENOUS | Status: AC
  Filled 2018-11-09: qty 20

## 2018-11-09 MED ORDER — HYDROCODONE-ACETAMINOPHEN 7.5-325 MG PO TABS: 1.0000 | ORAL_TABLET | Freq: Once | ORAL | Status: DC | PRN

## 2018-11-09 MED ORDER — ONDANSETRON HCL 4 MG/2ML IJ SOLN
INTRAMUSCULAR | Status: AC
  Filled 2018-11-09: qty 2

## 2018-11-09 MED ORDER — PROPOFOL 10 MG/ML IV BOLUS
INTRAVENOUS | Status: DC | PRN
  Administered 2018-11-09: 150 mg via INTRAVENOUS

## 2018-11-09 MED ORDER — LIDOCAINE HCL (PF) 2 % IJ SOLN
INTRAMUSCULAR | Status: AC
  Filled 2018-11-09: qty 10

## 2018-11-09 SURGICAL SUPPLY — 61 items
ANCHOR TIS RET SYS 235ML (MISCELLANEOUS) ×3 IMPLANT
APPLIER CLIP 5 13 M/L LIGAMAX5 (MISCELLANEOUS) ×3
BLADE SURG SZ11 CARB STEEL (BLADE) ×3 IMPLANT
BULB RESERV EVAC DRAIN JP 100C (MISCELLANEOUS) ×3 IMPLANT
CANISTER SUCT 1200ML W/VALVE (MISCELLANEOUS) ×3 IMPLANT
CHLORAPREP W/TINT 26 (MISCELLANEOUS) ×3 IMPLANT
CHOLANGIOGRAM CATH TAUT (CATHETERS) IMPLANT
CLIP APPLIE 5 13 M/L LIGAMAX5 (MISCELLANEOUS) ×1 IMPLANT
COVER WAND RF STERILE (DRAPES) ×3 IMPLANT
DECANTER SPIKE VIAL GLASS SM (MISCELLANEOUS) ×6 IMPLANT
DEFOGGER SCOPE WARMER CLEARIFY (MISCELLANEOUS) ×3 IMPLANT
DERMABOND ADVANCED (GAUZE/BANDAGES/DRESSINGS) ×2
DERMABOND ADVANCED .7 DNX12 (GAUZE/BANDAGES/DRESSINGS) ×1 IMPLANT
DISSECTOR BLUNT TIP ENDO 5MM (MISCELLANEOUS) ×3 IMPLANT
DISSECTOR KITTNER STICK (MISCELLANEOUS) IMPLANT
DISSECTORS/KITTNER STICK (MISCELLANEOUS)
DRAIN CHANNEL JP 15F RND 16 (MISCELLANEOUS) ×3 IMPLANT
DRAPE 3/4 80X56 (DRAPES) IMPLANT
DRAPE C-ARM XRAY 36X54 (DRAPES) IMPLANT
ELECT CAUTERY BLADE 6.4 (BLADE) ×3 IMPLANT
ELECT REM PT RETURN 9FT ADLT (ELECTROSURGICAL) ×3
ELECTRODE REM PT RTRN 9FT ADLT (ELECTROSURGICAL) ×1 IMPLANT
GLOVE BIOGEL PI IND STRL 7.0 (GLOVE) ×1 IMPLANT
GLOVE BIOGEL PI INDICATOR 7.0 (GLOVE) ×2
GLOVE SURG SYN 6.5 ES PF (GLOVE) ×3 IMPLANT
GOWN STRL REUS W/ TWL LRG LVL3 (GOWN DISPOSABLE) ×3 IMPLANT
GOWN STRL REUS W/TWL LRG LVL3 (GOWN DISPOSABLE) ×6
GRASPER SUT TROCAR 14GX15 (MISCELLANEOUS) ×3 IMPLANT
IRRIGATION STRYKERFLOW (MISCELLANEOUS) ×1 IMPLANT
IRRIGATOR STRYKERFLOW (MISCELLANEOUS) ×3
IV CATH ANGIO 12GX3 LT BLUE (NEEDLE) IMPLANT
IV NS 1000ML (IV SOLUTION) ×2
IV NS 1000ML BAXH (IV SOLUTION) ×1 IMPLANT
JACKSON PRATT 10 (INSTRUMENTS) IMPLANT
L-HOOK LAP DISP 36CM (ELECTROSURGICAL) ×3
LABEL OR SOLS (LABEL) ×3 IMPLANT
LHOOK LAP DISP 36CM (ELECTROSURGICAL) ×1 IMPLANT
NEEDLE HYPO 22GX1.5 SAFETY (NEEDLE) ×3 IMPLANT
NEEDLE INSUFFLATION 14GA 120MM (NEEDLE) ×3 IMPLANT
PACK LAP CHOLECYSTECTOMY (MISCELLANEOUS) ×3 IMPLANT
PENCIL ELECTRO HAND CTR (MISCELLANEOUS) ×3 IMPLANT
PORT ACCESS TROCAR AIRSEAL 5 (TROCAR) ×3 IMPLANT
SCISSORS METZENBAUM CVD 33 (INSTRUMENTS) ×3 IMPLANT
SET TRI-LUMEN FLTR TB AIRSEAL (TUBING) ×3 IMPLANT
SLEEVE ENDOPATH XCEL 5M (ENDOMECHANICALS) ×3 IMPLANT
SPONGE DRAIN TRACH 4X4 STRL 2S (GAUZE/BANDAGES/DRESSINGS) ×3 IMPLANT
SPONGE LAP 18X18 RF (DISPOSABLE) IMPLANT
STOPCOCK 4 WAY LG BORE MALE ST (IV SETS) IMPLANT
SUT ETHILON 3-0 FS-10 30 BLK (SUTURE) ×3
SUT MNCRL 4-0 (SUTURE) ×2
SUT MNCRL 4-0 27XMFL (SUTURE) ×1
SUT VIC AB 3-0 SH 27 (SUTURE)
SUT VIC AB 3-0 SH 27X BRD (SUTURE) IMPLANT
SUT VICRYL 0 AB UR-6 (SUTURE) ×6 IMPLANT
SUTURE EHLN 3-0 FS-10 30 BLK (SUTURE) ×1 IMPLANT
SUTURE MNCRL 4-0 27XMF (SUTURE) ×1 IMPLANT
SYR 20ML LL LF (SYRINGE) ×3 IMPLANT
SYR 50ML LL SCALE MARK (SYRINGE) ×3 IMPLANT
TROCAR XCEL BLUNT TIP 100MML (ENDOMECHANICALS) ×3 IMPLANT
TROCAR XCEL NON-BLD 5MMX100MML (ENDOMECHANICALS) ×3 IMPLANT
WATER STERILE IRR 1000ML POUR (IV SOLUTION) ×3 IMPLANT

## 2018-11-09 NOTE — Anesthesia Procedure Notes (Signed)
Procedure Name: Intubation Date/Time: 11/09/2018 7:51 AM Performed by: Caryl Asp, CRNA Pre-anesthesia Checklist: Patient identified, Patient being monitored, Timeout performed, Emergency Drugs available and Suction available Patient Re-evaluated:Patient Re-evaluated prior to induction Oxygen Delivery Method: Circle system utilized Preoxygenation: Pre-oxygenation with 100% oxygen Induction Type: IV induction Ventilation: Mask ventilation without difficulty Laryngoscope Size: Mac and 4 Grade View: Grade I Tube type: Oral Tube size: 7.5 mm Number of attempts: 1 Airway Equipment and Method: Stylet Placement Confirmation: ETT inserted through vocal cords under direct vision,  positive ETCO2 and breath sounds checked- equal and bilateral Secured at: 23 cm Tube secured with: Tape Dental Injury: Teeth and Oropharynx as per pre-operative assessment

## 2018-11-09 NOTE — Transfer of Care (Signed)
Immediate Anesthesia Transfer of Care Note  Patient: DEMECO DUCKSWORTH  Procedure(s) Performed: LAPAROSCOPIC CHOLECYSTECTOMY (N/A Abdomen)  Patient Location: PACU  Anesthesia Type:General  Level of Consciousness: sedated  Airway & Oxygen Therapy: Patient Spontanous Breathing and Patient connected to face mask oxygen  Post-op Assessment: Report given to RN and Post -op Vital signs reviewed and stable  Post vital signs: Reviewed and stable  Last Vitals:  Vitals Value Taken Time  BP 111/62 11/09/18 1050  Temp    Pulse 69 11/09/18 1052  Resp 17 11/09/18 1052  SpO2 98 % 11/09/18 1052  Vitals shown include unvalidated device data.  Last Pain:  Vitals:   11/09/18 0541  TempSrc: Oral  PainSc:          Complications: No apparent anesthesia complications

## 2018-11-09 NOTE — Plan of Care (Signed)
Patient doing well.  He had a lap chole this morning.  Dermabond to the incisions C/D/I.  Patient tolerating clear liquids.  He has ambulated in the hallway.  JP with minimal output.  No significant changes.

## 2018-11-09 NOTE — Op Note (Signed)
Preoperative diagnosis:  acute and cholecystitis  Postoperative diagnosis: same as above  Procedure: Laparoscopic Cholecystectomy.   Anesthesia: GETA   Surgeon: Benjamine Sprague  Specimen: Gallbladder  Complications: None  EBL: 19mL  Wound Classification: Clean Contaminated  Indications: see HPI  Findings: Critical view of safety noted Cystic duct and artery identified, ligated and divided, clips remained intact at end of procedure Adequate hemostasis  Description of procedure: The patient was placed on the operating table in the supine position. SCDs placed, pre-op abx administered.  General anesthesia was induced and OG tube placed by anesthesia. A time-out was completed verifying correct patient, procedure, site, positioning, and implant(s) and/or special equipment prior to beginning this procedure. The abdomen was prepped and draped in the usual sterile fashion.  An incision was made in a natural skin line under the umbilicus.  Dissection carried down to fascia where two 0 vicryl sutures placed to use as anchor sutures for hasson port.  Incision made into fascia and blunt dissection used to enter peritoneum.  Hasson port placed and insufflation started up to 64mm Hg without any dramatic increase in pressure.    The laparoscope was inserted and the abdomen inspected. No injuries from initial trocar placement were noted. Additional trocars were then inserted under direct visualization in the following locations: a 5-mm trocar in the subxyphoid region and two 5-mm trocars along the right costal margin. The abdomen was inspected and no abnormalities or injuries were found. The table was placed in the reverse Trendelenburg position with the right side up.   Gallbladder decompressed with needle to facilitate retraction.  Filmy adhesions between the gallbladder and omentum, duodenum and transverse colon were lysed sharply. The dome of the gallbladder was grasped with an atraumatic grasper  passed through the lateral port and retracted over the dome of the liver. The infundibulum was also grasped with an atraumatic grasper and retracted toward the right lower quadrant. This maneuver exposed Calot's triangle. The very thick peritoneum overlying the gallbladder infundibulum was then miticulously dissected and the cystic duct and cystic artery eventually identified.  Critical view of safety with the liver bed clearly visible behind the duct and artery with no additional structures noted.  Cystic duct and cystic artery clipped and divided close to the gallbladder.  The gallbladder was then dissected from its peritoneal and liver bed attachments by electrocautery. Hemostasis was checked and the gallbladder was removed using an endoscopic retrieval bag placed through the umbilical port. The gallbladder was passed off the table as a specimen. The gallbladder fossa was copiously irrigated with saline and any leaked bile was suctioned out, and hemostasis was obtained.  Due to the extensive inflammation and dissection required to complete procedure, blake drain placed through far lateral port to monitor for post op infection and/or bile leak. Drain secured to skin with 3-0 nylon.  There was no evidence of bleeding from the gallbladder fossa or cystic artery or leakage of the bile from the cystic duct stump. The umbilical trocar removed. Abdomen desufflated and secondary trocars were removed under direct vision. No bleeding was noted. 0 vicryl in a figure of eight fashion x2 to close umbilical site.   3-0 vicryl used to close deep dermal layer at umbilical site.  All skin incisions then closed with subcuticular sutures of 4-0 monocryl and dressed with topical skin adhesive. The orogastric tube was removed and patient extubated. The patient tolerated the procedure well and was taken to the postanesthesia care unit in stable condition.  All sponge  and instrument count correct at end of procedure.

## 2018-11-09 NOTE — Anesthesia Preprocedure Evaluation (Signed)
Anesthesia Evaluation  Patient identified by MRN, date of birth, ID band Patient awake    Reviewed: Allergy & Precautions, H&P , NPO status , reviewed documented beta blocker date and time   Airway Mallampati: II  TM Distance: >3 FB Neck ROM: full    Dental  (+) Poor Dentition, Chipped   Pulmonary    Pulmonary exam normal        Cardiovascular Normal cardiovascular exam     Neuro/Psych    GI/Hepatic GERD  Controlled,  Endo/Other    Renal/GU      Musculoskeletal   Abdominal   Peds  Hematology   Anesthesia Other Findings Past Medical History: No date: GERD (gastroesophageal reflux disease) Past Surgical History: No date: APPENDECTOMY BMI    Body Mass Index: 33.91 kg/m     Reproductive/Obstetrics                             Anesthesia Physical Anesthesia Plan  ASA: II  Anesthesia Plan: General   Post-op Pain Management:    Induction: Intravenous  PONV Risk Score and Plan: 3 and Ondansetron, Dexamethasone and Midazolam  Airway Management Planned: Oral ETT  Additional Equipment:   Intra-op Plan:   Post-operative Plan: Extubation in OR  Informed Consent: I have reviewed the patients History and Physical, chart, labs and discussed the procedure including the risks, benefits and alternatives for the proposed anesthesia with the patient or authorized representative who has indicated his/her understanding and acceptance.     Dental Advisory Given  Plan Discussed with: CRNA  Anesthesia Plan Comments:         Anesthesia Quick Evaluation

## 2018-11-09 NOTE — Anesthesia Postprocedure Evaluation (Signed)
Anesthesia Post Note  Patient: Kristopher Allen  Procedure(s) Performed: LAPAROSCOPIC CHOLECYSTECTOMY (N/A Abdomen)  Patient location during evaluation: PACU Anesthesia Type: General Level of consciousness: awake and alert Pain management: pain level controlled Vital Signs Assessment: post-procedure vital signs reviewed and stable Respiratory status: spontaneous breathing, nonlabored ventilation and respiratory function stable Cardiovascular status: blood pressure returned to baseline and stable Postop Assessment: no apparent nausea or vomiting Anesthetic complications: no     Last Vitals:  Vitals:   11/09/18 1228 11/09/18 1341  BP: 122/81 115/74  Pulse: 74 75  Resp: 15 16  Temp: 36.9 C 37 C  SpO2: 97% 94%    Last Pain:  Vitals:   11/09/18 1341  TempSrc: Oral  PainSc:                  Alphonsus Sias

## 2018-11-09 NOTE — Anesthesia Post-op Follow-up Note (Signed)
Anesthesia QCDR form completed.        

## 2018-11-10 LAB — CBC
HCT: 43.9 % (ref 39.0–52.0)
Hemoglobin: 15 g/dL (ref 13.0–17.0)
MCH: 30.2 pg (ref 26.0–34.0)
MCHC: 34.2 g/dL (ref 30.0–36.0)
MCV: 88.3 fL (ref 80.0–100.0)
Platelets: 294 10*3/uL (ref 150–400)
RBC: 4.97 MIL/uL (ref 4.22–5.81)
RDW: 13 % (ref 11.5–15.5)
WBC: 12 10*3/uL — ABNORMAL HIGH (ref 4.0–10.5)
nRBC: 0 % (ref 0.0–0.2)

## 2018-11-10 LAB — MAGNESIUM: Magnesium: 2.1 mg/dL (ref 1.7–2.4)

## 2018-11-10 LAB — HEPATIC FUNCTION PANEL
ALT: 104 U/L — ABNORMAL HIGH (ref 0–44)
AST: 49 U/L — ABNORMAL HIGH (ref 15–41)
Albumin: 3.6 g/dL (ref 3.5–5.0)
Alkaline Phosphatase: 90 U/L (ref 38–126)
Bilirubin, Direct: 0.1 mg/dL (ref 0.0–0.2)
Indirect Bilirubin: 0.4 mg/dL (ref 0.3–0.9)
Total Bilirubin: 0.5 mg/dL (ref 0.3–1.2)
Total Protein: 7.7 g/dL (ref 6.5–8.1)

## 2018-11-10 LAB — BASIC METABOLIC PANEL
Anion gap: 9 (ref 5–15)
BUN: 12 mg/dL (ref 6–20)
CO2: 27 mmol/L (ref 22–32)
Calcium: 8.8 mg/dL — ABNORMAL LOW (ref 8.9–10.3)
Chloride: 103 mmol/L (ref 98–111)
Creatinine, Ser: 0.96 mg/dL (ref 0.61–1.24)
GFR calc Af Amer: >60 mL/min (ref >60)
GFR calc non Af Amer: >60 mL/min (ref >60)
Glucose, Bld: 113 mg/dL — ABNORMAL HIGH (ref 70–99)
Potassium: 3.6 mmol/L (ref 3.5–5.1)
Sodium: 139 mmol/L (ref 135–145)

## 2018-11-10 LAB — PHOSPHORUS: Phosphorus: 3.6 mg/dL (ref 2.5–4.6)

## 2018-11-10 LAB — SURGICAL PATHOLOGY

## 2018-11-10 MED ORDER — HYDROCODONE-ACETAMINOPHEN 5-325 MG PO TABS: 1.0000 | ORAL_TABLET | Freq: Four times a day (QID) | ORAL | 0 refills | Status: AC | PRN

## 2018-11-10 MED ORDER — IBUPROFEN 800 MG PO TABS: 800.0000 mg | ORAL_TABLET | Freq: Three times a day (TID) | ORAL | 0 refills | Status: DC | PRN

## 2018-11-10 MED ORDER — CIPROFLOXACIN HCL 500 MG PO TABS: 500.0000 mg | ORAL_TABLET | Freq: Two times a day (BID) | ORAL | 0 refills | Status: AC

## 2018-11-10 MED ORDER — METRONIDAZOLE 500 MG PO TABS: 500.0000 mg | ORAL_TABLET | Freq: Three times a day (TID) | ORAL | 0 refills | Status: AC

## 2018-11-10 MED ORDER — ACETAMINOPHEN 325 MG PO TABS: 650.0000 mg | ORAL_TABLET | Freq: Three times a day (TID) | ORAL | 0 refills | Status: AC | PRN

## 2018-11-10 MED ORDER — SIMETHICONE 80 MG PO CHEW
80.0000 mg | CHEWABLE_TABLET | Freq: Four times a day (QID) | ORAL | Status: DC | PRN
  Administered 2018-11-10: 10:00:00 80 mg via ORAL
  Filled 2018-11-10 (×2): qty 1

## 2018-11-10 MED ORDER — DOCUSATE SODIUM 100 MG PO CAPS: 100.0000 mg | ORAL_CAPSULE | Freq: Two times a day (BID) | ORAL | 0 refills | Status: AC | PRN

## 2018-11-10 NOTE — Discharge Summary (Signed)
Physician Discharge Summary  Patient ID: Kristopher Allen MRN: [id number] DOB/AGE: 1985/12/26 33 y.o.  Admit date: 11/07/2018 Discharge date: 11/10/2018  Admission Diagnoses: Acute cholecystitis  Discharge Diagnoses:  Acute cholecystitis, gangrenous  Discharged Condition: good  Hospital Course: Admitted for above.  IV antibiotics started while waiting for operation.  Please see op notes for details.  Postop recovery unremarkable.  Time of discharge patient's pain controlled, tolerating regular diet, passing flatus.  Will send home to finish antibiotic course and drain in place due to the gangrenous nature of his cholecystitis.  Consults: None  Discharge Exam: Blood pressure 114/73, pulse 73, temperature 98.2 F (36.8 C), temperature source Oral, resp. rate 18, height 6' (1.829 m), weight 113.4 kg, SpO2 95 %. General appearance: alert, cooperative and no distress GI: soft, non-tender; bowel sounds normal; no masses,  no organomegaly. Drain with serosanguinous drainage  Disposition:  Discharge disposition: 01-Home or Self Care       Discharge Instructions    Discharge patient   Complete by: As directed    Discharge disposition: 01-Home or Self Care   Discharge patient date: 11/10/2018     Allergies as of 11/10/2018   No Known Allergies     Medication List    TAKE these medications   acetaminophen 325 MG tablet Commonly known as: Tylenol Take 2 tablets (650 mg total) by mouth every 8 (eight) hours as needed for mild pain.   ciprofloxacin 500 MG tablet Commonly known as: CIPRO Take 1 tablet (500 mg total) by mouth 2 (two) times daily for 7 days.   docusate sodium 100 MG capsule Commonly known as: Colace Take 1 capsule (100 mg total) by mouth 2 (two) times daily as needed for up to 10 days for mild constipation.   HYDROcodone-acetaminophen 5-325 MG tablet Commonly known as: Norco Take 1 tablet by mouth every 6 (six) hours as needed for up to 3 days for moderate  pain.   ibuprofen 800 MG tablet Commonly known as: ADVIL Take 1 tablet (800 mg total) by mouth every 8 (eight) hours as needed for mild pain or moderate pain.   metroNIDAZOLE 500 MG tablet Commonly known as: Flagyl Take 1 tablet (500 mg total) by mouth 3 (three) times daily for 7 days.      Follow-up Information    Banks, Glen Blatchley, DO Follow up.   Specialty: Surgery Why: for possible drain removal  Contact information: [address] [place] [address] [phone]            Total time spent arranging discharge was >45min. Signed: Benjamine Sprague 11/10/2018, 10:39 AM

## 2018-11-10 NOTE — Discharge Instructions (Signed)
Laparoscopic Cholecystectomy, Care After This sheet gives you information about how to care for yourself after your procedure. Your doctor may also give you more specific instructions. If you have problems or questions, contact your doctor. Follow these instructions at home: Care for cuts from surgery (incisions)   Follow instructions from your doctor about how to take care of your cuts from surgery. Make sure you: ? Wash your hands with soap and water before you change your bandage (dressing). If you cannot use soap and water, use hand sanitizer. ? Change your bandage as told by your doctor. ? Leave stitches (sutures), skin glue, or skin tape (adhesive) strips in place. They may need to stay in place for 2 weeks or longer. If tape strips get loose and curl up, you may trim the loose edges. Do not remove tape strips completely unless your doctor says it is okay.  Do not take baths, swim, or use a hot tub until your doctor says it is okay. OK TO SHOWER 24HRS AFTER YOUR SURGERY.   Check your surgical cut area every day for signs of infection. Check for: ? More redness, swelling, or pain. ? More fluid or blood. ? Warmth. ? Pus or a bad smell. Activity  Do not drive or use heavy machinery while taking prescription pain medicine.  Do not play contact sports until your doctor says it is okay.  Do not drive for 24 hours if you were given a medicine to help you relax (sedative).  Rest as needed. Do not return to work or school until your doctor says it is okay.  Measure and record daily JP output as instructed, please bring records to next appointment General instructions   tylenol and advil as needed for discomfort.  Please alternate between the two every four hours as needed for pain.     Use narcotics, if prescribed, only when tylenol and motrin is not enough to control pain.   325-650mg  every 8hrs to max of 3000mg /24hrs (including the 325mg  in every norco dose) for the tylenol.      Advil up to 800mg  per dose every 8hrs as needed for pain.    To prevent or treat constipation while you are taking prescription pain medicine, your doctor may recommend that you: ? Drink enough fluid to keep your pee (urine) clear or pale yellow. ? Take over-the-counter or prescription medicines. ? Eat foods that are high in fiber, such as fresh fruits and vegetables, whole grains, and beans. ? Limit foods that are high in fat and processed sugars, such as fried and sweet foods. Contact a doctor if:  You develop a rash.  You have more redness, swelling, or pain around your surgical cuts.  You have more fluid or blood coming from your surgical cuts.  Your surgical cuts feel warm to the touch.  You have pus or a bad smell coming from your surgical cuts.  You have a fever.  One or more of your surgical cuts breaks open. Get help right away if:  You have trouble breathing.  You have chest pain.  You have pain that is getting worse in your shoulders.  You faint or feel dizzy when you stand.  You have very bad pain in your belly (abdomen).  You are sick to your stomach (nauseous) for more than one day.  You have throwing up (vomiting) that lasts for more than one day.  You have leg pain. This information is not intended to replace advice given to you  by your health care provider. Make sure you discuss any questions you have with your health care provider. Document Released: 12/23/2007 Document Revised: 10/04/2015 Document Reviewed: 09/01/2015 Elsevier Interactive Patient Education  2019 [employer]

## 2018-11-10 NOTE — TOC Initial Note (Signed)
Transition of Care [facility]) - Initial/Assessment Note    Patient Details  Name: Kristopher Allen MRN: [id number] Date of Birth: [birth year]  [facility] CM/SW Contact:    Beverly Sessions, RN Phone Number: 11/10/2018, 12:10 PM  Clinical Narrative:     Patient to discharge today with prescriptions for Flagyl, Cipro, and Norco.   Patient request for [place]. RNCM spoke with pharmacist at   [facility]. They do not accept goodrx.  Out of pocket total would be $70.33.  Patient's mother at bedside.  Patient and mother requested for goodrx coupons to be printed for [facility].  They were printed and provided to patient    Expected Discharge Plan: Home/Self Care Barriers to Discharge: Barriers Resolved   Patient Goals and CMS Choice        Expected Discharge Plan and Services Expected Discharge Plan: Home/Self Care         Expected Discharge Date: 11/10/18                                    Prior Living Arrangements/Services                       Activities of Daily Living Home Assistive Devices/Equipment: None ADL Screening (condition at time of admission) Patient's cognitive ability adequate to safely complete daily activities?: Yes Is the patient deaf or have difficulty hearing?: No Does the patient have difficulty seeing, even when wearing glasses/contacts?: No Does the patient have difficulty concentrating, remembering, or making decisions?: No Patient able to express need for assistance with ADLs?: Yes Does the patient have difficulty dressing or bathing?: No Independently performs ADLs?: Yes (appropriate for developmental age) Does the patient have difficulty walking or climbing stairs?: No Weakness of Legs: None Weakness of Arms/Hands: None  Permission Sought/Granted                  Emotional Assessment              Admission diagnosis:  Acute cholecystitis [K81.0] RUQ pain [R10.11] Patient Active  Problem List   Diagnosis Date Noted  . Acute cholecystitis 11/07/2018   PCP:  No primary care provider on file. Pharmacy:   [place], [place] - [address] [place] [address] Phone: [phone] Fax: [phone]     Social Determinants of Health (SDOH) Interventions    Readmission Risk Interventions No flowsheet data found.

## 2018-11-10 NOTE — Progress Notes (Signed)
Kristopher Allen to be D/C'd home  per MD order.  Discussed prescriptions and follow up appointments with the patient. Prescriptions given to patient, medication list explained in detail. Pt verbalized understanding.  Allergies as of 11/10/2018   No Known Allergies     Medication List    TAKE these medications   acetaminophen 325 MG tablet Commonly known as: Tylenol Take 2 tablets (650 mg total) by mouth every 8 (eight) hours as needed for mild pain.   ciprofloxacin 500 MG tablet Commonly known as: CIPRO Take 1 tablet (500 mg total) by mouth 2 (two) times daily for 7 days.   docusate sodium 100 MG capsule Commonly known as: Colace Take 1 capsule (100 mg total) by mouth 2 (two) times daily as needed for up to 10 days for mild constipation.   HYDROcodone-acetaminophen 5-325 MG tablet Commonly known as: Norco Take 1 tablet by mouth every 6 (six) hours as needed for up to 3 days for moderate pain.   ibuprofen 800 MG tablet Commonly known as: ADVIL Take 1 tablet (800 mg total) by mouth every 8 (eight) hours as needed for mild pain or moderate pain.   metroNIDAZOLE 500 MG tablet Commonly known as: Flagyl Take 1 tablet (500 mg total) by mouth 3 (three) times daily for 7 days.       Vitals:   11/10/18 0558 11/10/18 1348  BP: 114/73 115/68  Pulse: 73 75  Resp:  18  Temp: 98.2 F (36.8 C) 98.7 F (37.1 C)  SpO2:  98%    Skin clean, dry and intact without evidence of skin break down, no evidence of skin tears noted. IV catheter discontinued intact. Site without signs and symptoms of complications. Dressing and pressure applied. Pt denies pain at this time. No complaints noted.  An After Visit Summary was printed and given to the patient. Patient escorted via [place], and D/C home via private auto.  Patient and mother educated on JP drain management and demonstrated proper teachback on managing the drain.   Kristopher Allen Kristopher Allen

## 2019-08-23 ENCOUNTER — Emergency Department
Admission: EM | Admit: 2019-08-23 | Discharge: 2019-08-23 | Disposition: A | Discharge status: Home / Self Care | Payer: BC Managed Care – PPO | Attending: Emergency Medicine | Admitting: Emergency Medicine

## 2019-08-23 ENCOUNTER — Emergency Department: Payer: BC Managed Care – PPO

## 2019-08-23 ENCOUNTER — Other Ambulatory Visit: Payer: Self-pay

## 2019-08-23 ENCOUNTER — Encounter: Payer: Self-pay | Admitting: Emergency Medicine

## 2019-08-23 DIAGNOSIS — R1013 Epigastric pain: Secondary | ICD-10-CM

## 2019-08-23 DIAGNOSIS — M25512 Pain in left shoulder: Secondary | ICD-10-CM | POA: Insufficient documentation

## 2019-08-23 DIAGNOSIS — R109 Unspecified abdominal pain: Secondary | ICD-10-CM

## 2019-08-23 DIAGNOSIS — G8929 Other chronic pain: Secondary | ICD-10-CM | POA: Diagnosis not present

## 2019-08-23 DIAGNOSIS — R52 Pain, unspecified: Secondary | ICD-10-CM

## 2019-08-23 LAB — CBC
HCT: 43.3 % (ref 39.0–52.0)
Hemoglobin: 15.4 g/dL (ref 13.0–17.0)
MCH: 30.4 pg (ref 26.0–34.0)
MCHC: 35.6 g/dL (ref 30.0–36.0)
MCV: 85.6 fL (ref 80.0–100.0)
Platelets: 276 10*3/uL (ref 150–400)
RBC: 5.06 MIL/uL (ref 4.22–5.81)
RDW: 13.2 % (ref 11.5–15.5)
WBC: 10.1 10*3/uL (ref 4.0–10.5)
nRBC: 0 % (ref 0.0–0.2)

## 2019-08-23 LAB — COMPREHENSIVE METABOLIC PANEL
ALT: 134 U/L — ABNORMAL HIGH (ref 0–44)
AST: 66 U/L — ABNORMAL HIGH (ref 15–41)
Albumin: 4.9 g/dL (ref 3.5–5.0)
Alkaline Phosphatase: 105 U/L (ref 38–126)
Anion gap: 9 (ref 5–15)
BUN: 15 mg/dL (ref 6–20)
CO2: 25 mmol/L (ref 22–32)
Calcium: 9.1 mg/dL (ref 8.9–10.3)
Chloride: 104 mmol/L (ref 98–111)
Creatinine, Ser: 1.02 mg/dL (ref 0.61–1.24)
GFR calc Af Amer: >60 mL/min (ref >60)
GFR calc non Af Amer: >60 mL/min (ref >60)
Glucose, Bld: 95 mg/dL (ref 70–99)
Potassium: 3.7 mmol/L (ref 3.5–5.1)
Sodium: 138 mmol/L (ref 135–145)
Total Bilirubin: 0.8 mg/dL (ref 0.3–1.2)
Total Protein: 9 g/dL — ABNORMAL HIGH (ref 6.5–8.1)

## 2019-08-23 LAB — URINALYSIS, COMPLETE (UACMP) WITH MICROSCOPIC
Bacteria, UA: NONE SEEN
Bilirubin Urine: NEGATIVE
Glucose, UA: NEGATIVE mg/dL
Hgb urine dipstick: NEGATIVE
Ketones, ur: NEGATIVE mg/dL
Leukocytes,Ua: NEGATIVE
Nitrite: NEGATIVE
Protein, ur: NEGATIVE mg/dL
Specific Gravity, Urine: 1.023 (ref 1.005–1.030)
Squamous Epithelial / HPF: NONE SEEN (ref 0–5)
pH: 6 (ref 5.0–8.0)

## 2019-08-23 LAB — LIPASE, BLOOD: Lipase: 23 U/L (ref 11–51)

## 2019-08-23 MED ORDER — OMEPRAZOLE 20 MG PO CPDR: 20.0000 mg | DELAYED_RELEASE_CAPSULE | Freq: Every day | ORAL | 0 refills | Status: DC

## 2019-08-23 MED ORDER — IBUPROFEN 600 MG PO TABS: 600.0000 mg | ORAL_TABLET | Freq: Three times a day (TID) | ORAL | 0 refills | Status: DC | PRN

## 2019-08-23 MED ORDER — KETOROLAC TROMETHAMINE 30 MG/ML IJ SOLN
INTRAMUSCULAR | Status: AC
  Filled 2019-08-23: qty 2

## 2019-08-23 NOTE — ED Triage Notes (Signed)
Patient states he began to have stomach pain last night while he was working.  Patient is pointing to an area to the middle of his abdomen as the area that is sore/painful.  Patient states pain began when he was lifting a "bobbin of yarn" that is 25lb.  Patient is also complaining of left shoulder pain x 1 month and right foot pain "for a good while".  Patient denies vomiting and diarrhea.  Patient is in no obvious distress at this time.

## 2019-08-23 NOTE — ED Provider Notes (Signed)
[facility] Emergency Department Provider Note  ____________________________________________   First MD Initiated Contact with Patient 08/23/19 1349     (approximate)  I have reviewed the triage vital signs and the nursing notes.   HISTORY  Chief Complaint Shoulder Pain, Foot Pain, and Abdominal Pain    HPI Kristopher Allen is a 34 y.o. male  Here with atypical upper abdominal pain, left shoulder pain. His upper abd pain is described as a fullness, aching sensation in his upper abdomen that feels like a "bulge" when he is lifting something. He states it began while lifting at work yesterday and has persisted since onset. He has had associated mild nausea but no vomiting. No change with eating/drinking. No specific alleviating factors. No h/o prior injuries or hernia in the area. No actual CP, SOb, palpitations. He is s/p cholecystectomy and appendectomy.     Shoulder pain is left, chronic, worse w/ movement and lifting. No prior injuries but he does frequent lifting at work. No numbness, weakness.    Past Medical History:  Diagnosis Date  . GERD (gastroesophageal reflux disease)     Patient Active Problem List   Diagnosis Date Noted  . Acute cholecystitis 11/07/2018    Past Surgical History:  Procedure Laterality Date  . APPENDECTOMY    . CHOLECYSTECTOMY N/A 11/09/2018   Procedure: LAPAROSCOPIC CHOLECYSTECTOMY;  Surgeon: Benjamine Sprague, DO;  Location: [facility] ORS;  Service: General;  Laterality: N/A;    Prior to Admission medications   Medication Sig Start Date End Date Taking? Authorizing Provider  ibuprofen (ADVIL) 600 MG tablet Take 1 tablet (600 mg total) by mouth every 8 (eight) hours as needed for moderate pain. 08/23/19   Duffy Bruce, MD  omeprazole (PRILOSEC) 20 MG capsule Take 1 capsule (20 mg total) by mouth daily for 7 days. 08/23/19 08/30/19  Duffy Bruce, MD    Allergies Patient has no known allergies.  No family history on  file.  Social History Social History   Tobacco Use  . Smoking status: Never Smoker  . Smokeless tobacco: Never Used  Substance Use Topics  . Alcohol use: Never  . Drug use: Never    Review of Systems  Review of Systems  Constitutional: Negative for chills, fatigue and fever.  HENT: Negative for sore throat.   Respiratory: Positive for chest tightness. Negative for shortness of breath.   Cardiovascular: Positive for chest pain.  Gastrointestinal: Positive for nausea. Negative for abdominal pain.  Genitourinary: Negative for flank pain.  Musculoskeletal: Positive for arthralgias. Negative for neck pain.  Skin: Negative for rash and wound.  Allergic/Immunologic: Negative for immunocompromised state.  Neurological: Negative for weakness and numbness.  Hematological: Does not bruise/bleed easily.  All other systems reviewed and are negative.    ____________________________________________  PHYSICAL EXAM:      VITAL SIGNS: ED Triage Vitals  Enc Vitals Group     BP 08/23/19 0920 (!) 140/95     Pulse Rate 08/23/19 0920 81     Resp 08/23/19 0920 16     Temp 08/23/19 0920 98.2 F (36.8 C)     Temp Source 08/23/19 0920 Oral     SpO2 08/23/19 0920 98 %     Weight 08/23/19 0924 245 lb (111.1 kg)     Height 08/23/19 0924 6\' 1"  (1.854 m)     Head Circumference --      Peak Flow --      Pain Score 08/23/19 0924 3     Pain  Loc --      Pain Edu? --      Excl. in GC? --      Physical Exam Vitals and nursing note reviewed.  Constitutional:      General: He is not in acute distress.    Appearance: He is well-developed.  HENT:     Head: Normocephalic and atraumatic.     Mouth/Throat:     Mouth: Mucous membranes are moist.  Eyes:     Conjunctiva/sclera: Conjunctivae normal.  Cardiovascular:     Rate and Rhythm: Normal rate and regular rhythm.     Heart sounds: Normal heart sounds. No murmur. No friction rub.  Pulmonary:     Effort: Pulmonary effort is normal. No  respiratory distress.     Breath sounds: Normal breath sounds. No wheezing or rales.  Abdominal:     General: There is no distension.     Palpations: Abdomen is soft.     Tenderness: There is abdominal tenderness in the epigastric area. There is no guarding or rebound.  Musculoskeletal:     Cervical back: Neck supple.  Skin:    General: Skin is warm.     Capillary Refill: Capillary refill takes less than 2 seconds.  Neurological:     Mental Status: He is alert and oriented to person, place, and time.     Motor: No abnormal muscle tone.       ____________________________________________   LABS (all labs ordered are listed, but only abnormal results are displayed)  Labs Reviewed  COMPREHENSIVE METABOLIC PANEL - Abnormal; Notable for the following components:      Result Value   Total Protein 9.0 (*)    AST 66 (*)    ALT 134 (*)    All other components within normal limits  URINALYSIS, COMPLETE (UACMP) WITH MICROSCOPIC - Abnormal; Notable for the following components:   Color, Urine YELLOW (*)    APPearance CLEAR (*)    All other components within normal limits  LIPASE, BLOOD  CBC    ____________________________________________  EKG: EKG: Normal sinus rhythm, VR 81. PR 146, QRS 90, QTc 429. No acute ST elevations or depressions. No ischemia or infarct. ________________________________________  RADIOLOGY All imaging, including plain films, CT scans, and ultrasounds, independently reviewed by me, and interpretations confirmed via formal radiology reads.  ED MD interpretation:   KUB: Unremarkable Xr Shoulder: No acute changes, possible chronic intra-articular foreign body  Official radiology report(s): DG Shoulder Left  Result Date: 08/23/2019 CLINICAL DATA:  Abdominal pain. EXAM: LEFT SHOULDER - 2+ VIEW COMPARISON:  None. FINDINGS: There is no acute displaced fracture or dislocation. There are few tiny osseous structures projecting within the joint space suspicious for  intra-articular loose bodies. There are no significant degenerative changes. IMPRESSION: 1. No acute abnormality. 2. No significant degenerative changes. 3. Questionable small intra-articular loose bodies. Electronically Signed   By: Katherine Mantle M.D.   On: 08/23/2019 15:17   DG Abd 2 Views  Result Date: 08/23/2019 CLINICAL DATA:  Pain EXAM: ABDOMEN - 2 VIEW COMPARISON:  None. FINDINGS: There is no acute abnormality. There is no evidence for small bowel obstruction. There is an above average amount of stool in the colon. There are no radiopaque kidney stones. IMPRESSION: 1. No radiopaque kidney stones. 2. Above average amount of stool in the colon. Electronically Signed   By: Katherine Mantle M.D.   On: 08/23/2019 15:19    ____________________________________________  PROCEDURES   Procedure(s) performed (including Critical Care):  Procedures  ____________________________________________  INITIAL IMPRESSION / MDM / ASSESSMENT AND PLAN / ED COURSE  As part of my medical decision making, I reviewed the following data within the electronic MEDICAL RECORD NUMBER Nursing notes reviewed and incorporated, Old chart reviewed, Notes from prior ED visits, and [place] Controlled Substance Database       *Kristopher Allen was evaluated in Emergency Department on 08/23/2019 for the symptoms described in the history of present illness. He was evaluated in the context of the global COVID-19 pandemic, which necessitated consideration that the patient might be at risk for infection with the SARS-CoV-2 virus that causes COVID-19. Institutional protocols and algorithms that pertain to the evaluation of patients at risk for COVID-19 are in a state of rapid change based on information released by regulatory bodies including the CDC and federal and state organizations. These policies and algorithms were followed during the patient's care in the ED.  Some ED evaluations and interventions may be delayed as a result  of limited staffing during the pandemic.*     Medical Decision Making:  34 yo M here with atypical epigastric pain. Re: his epigastric pain, abdomen is soft, NT, ND. Lipase neg, doubt pancreatitis. S/p cholecystectomy. 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 Pain is reproducible on exam and related with lifting - suspect MSK abd wall pain versus mild ventral hernia given his obesity. KUB shows no obstruction, mass, or abnormality. Will treat with NSAIDs, also give GI ppx in event of a GERD/gastritis component. EKG nonischemic, doubt referred cardiac sx.  Re: shoulder pain - likely chronic rotator cuff. NSAIDs as above. Minimize heavy lifting. XR negative. Distal NV is intact.  ____________________________________________  FINAL CLINICAL IMPRESSION(S) / ED DIAGNOSES  Final diagnoses:  Epigastric abdominal pain  Abdominal wall pain  Chronic left shoulder pain     MEDICATIONS GIVEN DURING THIS VISIT:  Medications  ketorolac (TORADOL) 30 MG/ML injection (  Given 08/23/19 1356)     ED Discharge Orders         Ordered    ibuprofen (ADVIL) 600 MG tablet  Every 8 hours PRN     08/23/19 1404    omeprazole (PRILOSEC) 20 MG capsule  Daily     08/23/19 1404           Note:  This document was prepared using Dragon voice recognition software and may include unintentional dictation errors.   Shaune Pollack, MD 08/23/19 [phone]

## 2019-08-23 NOTE — Discharge Instructions (Signed)
As we discussed, your symptoms very well could be due to a small hiatal or or ventral wall hernia, or abdominal wall pain from lifting.  For now, I would try to minimize heavy lifting.  When you do lift something, try to breathe throughout the lifting, to prevent increase in your abdominal pressure.  Take the ibuprofen as needed for moderate pain.  Also given you an antacid to help while taking the ibuprofen, to prevent any kind of stomach upset.  The ibuprofen should help with your left shoulder pain as well.  If your pain does not improve over the next week, I would recommend following up with one of the primary care clinics listed in this paperwork.  You may ultimately need to be referred to a surgeon, but I would try symptomatic treatment at this time first.

## 2020-04-10 ENCOUNTER — Emergency Department: Payer: BC Managed Care – PPO

## 2020-04-10 ENCOUNTER — Emergency Department
Admission: EM | Admit: 2020-04-10 | Discharge: 2020-04-10 | Disposition: A | Discharge status: Home / Self Care | Payer: BC Managed Care – PPO | Attending: Emergency Medicine | Admitting: Emergency Medicine

## 2020-04-10 ENCOUNTER — Encounter: Payer: Self-pay | Admitting: Emergency Medicine

## 2020-04-10 ENCOUNTER — Other Ambulatory Visit: Payer: Self-pay

## 2020-04-10 DIAGNOSIS — N200 Calculus of kidney: Secondary | ICD-10-CM

## 2020-04-10 DIAGNOSIS — K219 Gastro-esophageal reflux disease without esophagitis: Secondary | ICD-10-CM | POA: Diagnosis not present

## 2020-04-10 DIAGNOSIS — R109 Unspecified abdominal pain: Secondary | ICD-10-CM | POA: Diagnosis present

## 2020-04-10 LAB — URINALYSIS, COMPLETE (UACMP) WITH MICROSCOPIC
Bilirubin Urine: NEGATIVE
Glucose, UA: NEGATIVE mg/dL
Ketones, ur: NEGATIVE mg/dL
Leukocytes,Ua: NEGATIVE
Nitrite: NEGATIVE
Protein, ur: 100 mg/dL — AB
RBC / HPF: >50 RBC/hpf — ABNORMAL HIGH (ref 0–5)
Specific Gravity, Urine: 1.025 (ref 1.005–1.030)
Squamous Epithelial / HPF: NONE SEEN (ref 0–5)
pH: 5 (ref 5.0–8.0)

## 2020-04-10 LAB — BASIC METABOLIC PANEL
Anion gap: 13 (ref 5–15)
BUN: 14 mg/dL (ref 6–20)
CO2: 19 mmol/L — ABNORMAL LOW (ref 22–32)
Calcium: 9.2 mg/dL (ref 8.9–10.3)
Chloride: 107 mmol/L (ref 98–111)
Creatinine, Ser: 1.22 mg/dL (ref 0.61–1.24)
GFR, Estimated: >60 mL/min (ref >60)
Glucose, Bld: 125 mg/dL — ABNORMAL HIGH (ref 70–99)
Potassium: 3.9 mmol/L (ref 3.5–5.1)
Sodium: 139 mmol/L (ref 135–145)

## 2020-04-10 LAB — CBC
HCT: 45.2 % (ref 39.0–52.0)
Hemoglobin: 15.8 g/dL (ref 13.0–17.0)
MCH: 30.4 pg (ref 26.0–34.0)
MCHC: 35 g/dL (ref 30.0–36.0)
MCV: 86.9 fL (ref 80.0–100.0)
Platelets: 267 10*3/uL (ref 150–400)
RBC: 5.2 MIL/uL (ref 4.22–5.81)
RDW: 13.2 % (ref 11.5–15.5)
WBC: 10.9 10*3/uL — ABNORMAL HIGH (ref 4.0–10.5)
nRBC: 0 % (ref 0.0–0.2)

## 2020-04-10 MED ORDER — KETOROLAC TROMETHAMINE 30 MG/ML IJ SOLN
15.0000 mg | Freq: Once | INTRAMUSCULAR | Status: AC
  Administered 2020-04-10: 15 mg via INTRAVENOUS
  Filled 2020-04-10: qty 1

## 2020-04-10 MED ORDER — ONDANSETRON 4 MG PO TBDP: 4.0000 mg | ORAL_TABLET | Freq: Three times a day (TID) | ORAL | 0 refills | Status: DC | PRN

## 2020-04-10 MED ORDER — ONDANSETRON HCL 4 MG/2ML IJ SOLN
4.0000 mg | Freq: Once | INTRAMUSCULAR | Status: AC
  Administered 2020-04-10: 4 mg via INTRAVENOUS
  Filled 2020-04-10: qty 2

## 2020-04-10 MED ORDER — OXYCODONE-ACETAMINOPHEN 5-325 MG PO TABS
1.0000 | ORAL_TABLET | ORAL | Status: AC | PRN
  Administered 2020-04-10 (×2): 1 via ORAL
  Filled 2020-04-10 (×2): qty 1

## 2020-04-10 MED ORDER — OXYCODONE-ACETAMINOPHEN 5-325 MG PO TABS: 1.0000 | ORAL_TABLET | ORAL | 0 refills | Status: DC | PRN

## 2020-04-10 MED ORDER — LACTATED RINGERS IV BOLUS
1000.0000 mL | Freq: Once | INTRAVENOUS | Status: AC
  Administered 2020-04-10: 1000 mL via INTRAVENOUS

## 2020-04-10 NOTE — ED Triage Notes (Signed)
Pt comes into the ED via POV c/o left flank pain that started this morning.  Denies any h/o kidney stones.  Pt has had some N/V associated with it.  Pt denies any difficulty urinating at this time.  Pt in NAD with even and unlabored respirations and is ambulatory to triage.

## 2020-04-10 NOTE — ED Notes (Signed)
Pt given water and crackers per EDP. Pt states pain is gone. Pt tolerating pos well.

## 2020-04-10 NOTE — ED Notes (Signed)
Pt to CT

## 2020-04-10 NOTE — ED Provider Notes (Signed)
[facility] Emergency Department Provider Note   ____________________________________________   Event Date/Time   First MD Initiated Contact with Patient 04/10/20 0935     (approximate)  I have reviewed the triage vital signs and the nursing notes.   HISTORY  Chief Complaint Flank Pain    HPI Kristopher Allen is a 35 y.o. male with a past medical history of GERD who presents to the ED complaining of flank pain.  Patient reports that he was woken from sleep around 3-4 AM with severe pain in his left flank.  He has been feeling nauseous with 1 episode of vomiting, also noted blood in his urine this morning.  He denies any fevers and has not had any abdominal pain or dysuria.  He denies any history of kidney stones.        Past Medical History:  Diagnosis Date  . GERD (gastroesophageal reflux disease)     Patient Active Problem List   Diagnosis Date Noted  . Acute cholecystitis 11/07/2018    Past Surgical History:  Procedure Laterality Date  . APPENDECTOMY    . CHOLECYSTECTOMY N/A 11/09/2018   Procedure: LAPAROSCOPIC CHOLECYSTECTOMY;  Surgeon: Sung Amabile, DO;  Location: [facility] ORS;  Service: General;  Laterality: N/A;    Prior to Admission medications   Medication Sig Start Date End Date Taking? Authorizing Provider  ondansetron (ZOFRAN ODT) 4 MG disintegrating tablet Take 1 tablet (4 mg total) by mouth every 8 (eight) hours as needed for nausea or vomiting. 04/10/20  Yes Chesley Noon, MD  oxyCODONE-acetaminophen (PERCOCET) 5-325 MG tablet Take 1 tablet by mouth every 4 (four) hours as needed for severe pain. 04/10/20 04/10/21 Yes Chesley Noon, MD  ibuprofen (ADVIL) 600 MG tablet Take 1 tablet (600 mg total) by mouth every 8 (eight) hours as needed for moderate pain. 08/23/19   Shaune Pollack, MD  omeprazole (PRILOSEC) 20 MG capsule Take 1 capsule (20 mg total) by mouth daily for 7 days. 08/23/19 08/30/19  Shaune Pollack, MD     Allergies Patient has no known allergies.  History reviewed. No pertinent family history.  Social History Social History   Tobacco Use  . Smoking status: Never Smoker  . Smokeless tobacco: Never Used  Vaping Use  . Vaping Use: Never used  Substance Use Topics  . Alcohol use: Never  . Drug use: Never    Review of Systems  Constitutional: No fever/chills Eyes: No visual changes. ENT: No sore throat. Cardiovascular: Denies chest pain. Respiratory: Denies shortness of breath. Gastrointestinal: No abdominal pain.  Positive for flank pain, nausea, and vomiting.  No diarrhea.  No constipation. Genitourinary: Negative for dysuria. Musculoskeletal: Negative for back pain. Skin: Negative for rash. Neurological: Negative for headaches, focal weakness or numbness.  ____________________________________________   PHYSICAL EXAM:  VITAL SIGNS: ED Triage Vitals  Enc Vitals Group     BP 04/10/20 0732 138/65     Pulse Rate 04/10/20 0732 83     Resp 04/10/20 0732 16     Temp 04/10/20 0732 97.6 F (36.4 C)     Temp Source 04/10/20 0732 Oral     SpO2 04/10/20 0732 99 %     Weight 04/10/20 0726 250 lb (113.4 kg)     Height 04/10/20 0726 6' (1.829 m)     Head Circumference --      Peak Flow --      Pain Score 04/10/20 0726 8     Pain Loc --  Pain Edu? --      Excl. in GC? --     Constitutional: Alert and oriented. Eyes: Conjunctivae are normal. Head: Atraumatic. Nose: No congestion/rhinnorhea. Mouth/Throat: Mucous membranes are moist. Neck: Normal ROM Cardiovascular: Normal rate, regular rhythm. Grossly normal heart sounds. Respiratory: Normal respiratory effort.  No retractions. Lungs CTAB. Gastrointestinal: Soft and nontender.  CVA tenderness noted on left, no right CVA tenderness.  No distention. Genitourinary: deferred Musculoskeletal: No lower extremity tenderness nor edema. Neurologic:  Normal speech and language. No gross focal neurologic deficits are  appreciated. Skin:  Skin is warm, dry and intact. No rash noted. Psychiatric: Mood and affect are normal. Speech and behavior are normal.  ____________________________________________   LABS (all labs ordered are listed, but only abnormal results are displayed)  Labs Reviewed  URINALYSIS, COMPLETE (UACMP) WITH MICROSCOPIC - Abnormal; Notable for the following components:      Result Value   Color, Urine AMBER (*)    APPearance CLOUDY (*)    Hgb urine dipstick LARGE (*)    Protein, ur 100 (*)    RBC / HPF >50 (*)    Bacteria, UA FEW (*)    All other components within normal limits  BASIC METABOLIC PANEL - Abnormal; Notable for the following components:   CO2 19 (*)    Glucose, Bld 125 (*)    All other components within normal limits  CBC - Abnormal; Notable for the following components:   WBC 10.9 (*)    All other components within normal limits    PROCEDURES  Procedure(s) performed (including Critical Care):  Procedures   ____________________________________________   INITIAL IMPRESSION / ASSESSMENT AND PLAN / ED COURSE       35 year old male with past medical history of GERD who presents to the ED with acute onset left flank pain associated with nausea, vomiting, and hematuria.  Presentation appears most consistent with a kidney stone, labs thus far unremarkable and UA shows hematuria with some WBCs.  Suspicion for infection at this time as he has no leukocytosis, fever, or dysuria.  We will further assess with CT scan to treat patient's symptoms with IV Toradol and Zofran.  CT scan reviewed by me and shows left nephrolithiasis near the UVJ.  Patient feeling much better following dose of Toradol, he has been able to tolerate p.o. here in the ED.  He is appropriate for discharge home with urology follow-up as needed and was prescribed a course of pain and nausea medication.  He was counseled to return to the ED for new or worsening symptoms, patient agrees with plan.       ____________________________________________   FINAL CLINICAL IMPRESSION(S) / ED DIAGNOSES  Final diagnoses:  Kidney stone     ED Discharge Orders         Ordered    oxyCODONE-acetaminophen (PERCOCET) 5-325 MG tablet  Every 4 hours PRN        04/10/20 1154    ondansetron (ZOFRAN ODT) 4 MG disintegrating tablet  Every 8 hours PRN        04/10/20 1154           Note:  This document was prepared using Dragon voice recognition software and may include unintentional dictation errors.   Chesley Noon, MD 04/10/20 [phone]

## 2020-04-10 NOTE — ED Notes (Signed)
Pt approached First RN desk and states his pain is getting worse.  Pt has been pacing the triage lobby due to the pain.  Pain protocol placed.

## 2020-04-12 ENCOUNTER — Emergency Department: Payer: BC Managed Care – PPO

## 2020-04-12 ENCOUNTER — Encounter: Payer: Self-pay | Admitting: Emergency Medicine

## 2020-04-12 DIAGNOSIS — K219 Gastro-esophageal reflux disease without esophagitis: Secondary | ICD-10-CM | POA: Insufficient documentation

## 2020-04-12 DIAGNOSIS — R109 Unspecified abdominal pain: Secondary | ICD-10-CM | POA: Diagnosis present

## 2020-04-12 DIAGNOSIS — Z8719 Personal history of other diseases of the digestive system: Secondary | ICD-10-CM | POA: Insufficient documentation

## 2020-04-12 DIAGNOSIS — Z9049 Acquired absence of other specified parts of digestive tract: Secondary | ICD-10-CM | POA: Insufficient documentation

## 2020-04-12 DIAGNOSIS — N200 Calculus of kidney: Secondary | ICD-10-CM | POA: Diagnosis not present

## 2020-04-12 LAB — CBC
HCT: 43.9 % (ref 39.0–52.0)
Hemoglobin: 15.5 g/dL (ref 13.0–17.0)
MCH: 30.6 pg (ref 26.0–34.0)
MCHC: 35.3 g/dL (ref 30.0–36.0)
MCV: 86.6 fL (ref 80.0–100.0)
Platelets: 249 10*3/uL (ref 150–400)
RBC: 5.07 MIL/uL (ref 4.22–5.81)
RDW: 13 % (ref 11.5–15.5)
WBC: 13.6 10*3/uL — ABNORMAL HIGH (ref 4.0–10.5)
nRBC: 0 % (ref 0.0–0.2)

## 2020-04-12 LAB — COMPREHENSIVE METABOLIC PANEL
ALT: 135 U/L — ABNORMAL HIGH (ref 0–44)
AST: 71 U/L — ABNORMAL HIGH (ref 15–41)
Albumin: 4.4 g/dL (ref 3.5–5.0)
Alkaline Phosphatase: 86 U/L (ref 38–126)
Anion gap: 14 (ref 5–15)
BUN: 19 mg/dL (ref 6–20)
CO2: 22 mmol/L (ref 22–32)
Calcium: 9.2 mg/dL (ref 8.9–10.3)
Chloride: 100 mmol/L (ref 98–111)
Creatinine, Ser: 1.55 mg/dL — ABNORMAL HIGH (ref 0.61–1.24)
GFR, Estimated: 60 mL/min — ABNORMAL LOW (ref >60)
Glucose, Bld: 102 mg/dL — ABNORMAL HIGH (ref 70–99)
Potassium: 3.8 mmol/L (ref 3.5–5.1)
Sodium: 136 mmol/L (ref 135–145)
Total Bilirubin: 1.4 mg/dL — ABNORMAL HIGH (ref 0.3–1.2)
Total Protein: 8.3 g/dL — ABNORMAL HIGH (ref 6.5–8.1)

## 2020-04-12 LAB — URINALYSIS, COMPLETE (UACMP) WITH MICROSCOPIC
Bacteria, UA: NONE SEEN
Bilirubin Urine: NEGATIVE
Glucose, UA: NEGATIVE mg/dL
Ketones, ur: 20 mg/dL — AB
Leukocytes,Ua: NEGATIVE
Nitrite: NEGATIVE
Protein, ur: NEGATIVE mg/dL
Specific Gravity, Urine: 1.027 (ref 1.005–1.030)
pH: 5 (ref 5.0–8.0)

## 2020-04-12 NOTE — ED Triage Notes (Signed)
Pt c/o left flank pain since 1/13 when pt was seen in ED and diagnosed with 25mm, non-obstructing kidney stone. Pt to ED tonight due to continued pain and no relief from prescribed percocet. Pt denies hematuria and dysuria.

## 2020-04-13 ENCOUNTER — Emergency Department
Admission: EM | Admit: 2020-04-13 | Discharge: 2020-04-13 | Disposition: A | Discharge status: Home / Self Care | Payer: BC Managed Care – PPO | Attending: Emergency Medicine | Admitting: Emergency Medicine

## 2020-04-13 DIAGNOSIS — N200 Calculus of kidney: Secondary | ICD-10-CM

## 2020-04-13 MED ORDER — ONDANSETRON 4 MG PO TBDP
4.0000 mg | ORAL_TABLET | Freq: Once | ORAL | Status: AC
  Administered 2020-04-13: 4 mg via ORAL
  Filled 2020-04-13: qty 1

## 2020-04-13 MED ORDER — KETOROLAC TROMETHAMINE 60 MG/2ML IM SOLN
60.0000 mg | Freq: Once | INTRAMUSCULAR | Status: AC
  Administered 2020-04-13: 60 mg via INTRAMUSCULAR
  Filled 2020-04-13: qty 2

## 2020-04-13 MED ORDER — OXYCODONE-ACETAMINOPHEN 5-325 MG PO TABS
2.0000 | ORAL_TABLET | Freq: Once | ORAL | Status: AC
  Administered 2020-04-13: 2 via ORAL
  Filled 2020-04-13: qty 2

## 2020-04-13 MED ORDER — TAMSULOSIN HCL 0.4 MG PO CAPS
0.4000 mg | ORAL_CAPSULE | Freq: Every day | ORAL | 0 refills | Status: DC
Start: 2020-04-13 — End: 2020-07-14

## 2020-04-13 MED ORDER — OXYCODONE-ACETAMINOPHEN 5-325 MG PO TABS: 2.0000 | ORAL_TABLET | Freq: Four times a day (QID) | ORAL | 0 refills | Status: DC | PRN

## 2020-04-13 NOTE — ED Provider Notes (Addendum)
[facility] Emergency Department Provider Note   ____________________________________________   Event Date/Time   First MD Initiated Contact with Patient 04/13/20 [id number]     (approximate)  I have reviewed the triage vital signs and the nursing notes.   HISTORY  Chief Complaint Flank Pain    HPI Kristopher Allen is a 35 y.o. male with a recent diagnosis of a 2 mm  stone on the left side at the UVJ who presents to the emergency department with continued intermittent throbbing to the left lower back.  States he has been taking 1 Percocet tablet every 4 hours without relief.  No fever.  No vomiting.  States he is not sure why his back is still throbbing.  He has never had a kidney stone before.    Past Medical History:  Diagnosis Date  . GERD (gastroesophageal reflux disease)     Patient Active Problem List   Diagnosis Date Noted  . Acute cholecystitis 11/07/2018    Past Surgical History:  Procedure Laterality Date  . APPENDECTOMY    . CHOLECYSTECTOMY N/A 11/09/2018   Procedure: LAPAROSCOPIC CHOLECYSTECTOMY;  Surgeon: Sung Amabile, DO;  Location: [facility] ORS;  Service: General;  Laterality: N/A;    Prior to Admission medications   Medication Sig Start Date End Date Taking? Authorizing Provider  ibuprofen (ADVIL) 600 MG tablet Take 1 tablet (600 mg total) by mouth every 8 (eight) hours as needed for moderate pain. 08/23/19   Shaune Pollack, MD  omeprazole (PRILOSEC) 20 MG capsule Take 1 capsule (20 mg total) by mouth daily for 7 days. 08/23/19 08/30/19  Shaune Pollack, MD  ondansetron (ZOFRAN ODT) 4 MG disintegrating tablet Take 1 tablet (4 mg total) by mouth every 8 (eight) hours as needed for nausea or vomiting. 04/10/20   Chesley Noon, MD  oxyCODONE-acetaminophen (PERCOCET) 5-325 MG tablet Take 1 tablet by mouth every 4 (four) hours as needed for severe pain. 04/10/20 04/10/21  Chesley Noon, MD    Allergies Patient has no known  allergies.  History reviewed. No pertinent family history.  Social History Social History   Tobacco Use  . Smoking status: Never Smoker  . Smokeless tobacco: Never Used  Vaping Use  . Vaping Use: Never used  Substance Use Topics  . Alcohol use: Never  . Drug use: Never    Review of Systems  Constitutional: No fever/chills Eyes: No visual changes. ENT: No sore throat. Cardiovascular: Denies chest pain. Respiratory: Denies shortness of breath. Gastrointestinal: No abdominal pain.  No nausea, no vomiting.  No diarrhea.  No constipation. Genitourinary: Negative for dysuria. Musculoskeletal: + for back pain. Skin: Negative for rash. Neurological: Negative for headaches, focal weakness or numbness.   ____________________________________________   PHYSICAL EXAM:  VITAL SIGNS: ED Triage Vitals [04/12/20 2043]  Enc Vitals Group     BP 137/90     Pulse Rate 100     Resp 17     Temp 98.6 F (37 C)     Temp Source Oral     SpO2 98 %     Weight      Height      Head Circumference      Peak Flow      Pain Score      Pain Loc      Pain Edu?      Excl. in GC?     Constitutional: Alert and oriented. Well appearing and in no acute distress. Eyes: Conjunctivae are normal. PERRL. EOMI. Head: Atraumatic.  Nose: No congestion/rhinnorhea. Mouth/Throat: Mucous membranes are moist.  Oropharynx non-erythematous. Neck: No stridor.   Cardiovascular: Normal rate, regular rhythm. Grossly normal heart sounds.  Good peripheral circulation. Respiratory: Normal respiratory effort.  No retractions. Lungs CTAB. Gastrointestinal: Soft and nontender. No distention. No abdominal bruits. No CVA tenderness. Musculoskeletal: No lower extremity tenderness nor edema.  No joint effusions. Neurologic:  Normal speech and language. No gross focal neurologic deficits are appreciated. No gait instability. Skin:  Skin is warm, dry and intact. No rash noted. Psychiatric: Mood and affect are normal.  Speech and behavior are normal.  ____________________________________________   LABS (all labs ordered are listed, but only abnormal results are displayed)  Labs Reviewed  CBC - Abnormal; Notable for the following components:      Result Value   WBC 13.6 (*)    All other components within normal limits  COMPREHENSIVE METABOLIC PANEL - Abnormal; Notable for the following components:   Glucose, Bld 102 (*)    Creatinine, Ser 1.55 (*)    Total Protein 8.3 (*)    AST 71 (*)    ALT 135 (*)    Total Bilirubin 1.4 (*)    GFR, Estimated 60 (*)    All other components within normal limits  URINALYSIS, COMPLETE (UACMP) WITH MICROSCOPIC - Abnormal; Notable for the following components:   Color, Urine YELLOW (*)    APPearance CLEAR (*)    Hgb urine dipstick MODERATE (*)    Ketones, ur 20 (*)    All other components within normal limits   ____________________________________________  EKG  None ____________________________________________  RADIOLOGY  ED MD interpretation: 3 mm stone at the left UVJ  Official radiology report(s): DG Abdomen 1 View  Result Date: 04/12/2020 CLINICAL DATA:  Nephrolithiasis follow-up EXAM: ABDOMEN - 1 VIEW COMPARISON:  04/10/2020 CT abdomen pelvis FINDINGS: Unchanged position of ureteral stone near the ureterovesical junction. Stone measures 3 mm. Bowel gas pattern is normal. No other renal or ureteral calculi identified. IMPRESSION: Unchanged position of 3 mm stone near the right ureterovesical junction. Electronically Signed   By: Deatra Robinson M.D.   On: 04/12/2020 21:31    ____________________________________________   PROCEDURES  Procedure(s) performed: None  Procedures  Critical Care performed: No  ____________________________________________   INITIAL IMPRESSION / ASSESSMENT AND PLAN / ED COURSE  As part of my medical decision making, I reviewed the following data within the electronic MEDICAL RECORD NUMBER Nursing notes reviewed and  incorporated, Labs reviewed without acute abnormality, Old chart reviewed, Radiograph reviewed redemonstrating left kidney stone, Notes from prior ED visits and [place] Controlled Substance Database     Patient here with continued pain in the left lower back.  Well-appearing at this time.  States his pain is a 4/10.  Has a known 2 mm kidney stone at the left UVJ with mild hydronephrosis.  Labs again today reassuring other than mild leukocytosis which I suspect is reactive.  Minimally elevated creatinine at 1.55.  Was 1.223 days ago.  He is tolerating p.o.  Recommended increase fluid intake.  He is afebrile.  Urine shows blood but does not appear infected.  Have discussed with patient that it is normal to have intermittent and fluctuating pain with a kidney stone but given the size of his stone and location, it is very likely that this will pass with expectant management.  Have recommended he increase taking Percocet to 2 pills every 6 hours.  He states he only has 3 tablets left.  Will provide with refill.  States his mother drove him to the emergency department.  We will give him 2 Percocet now, IM Toradol.  I feel patient is safe to be discharged home.    At this time, I do not feel there is any life-threatening condition present. I have reviewed, interpreted and discussed all results (EKG, imaging, lab, urine as appropriate) and exam findings with patient/family. I have reviewed nursing notes and appropriate previous records.  I feel the patient is safe to be discharged home without further emergent workup and can continue workup as an outpatient as needed. Discussed usual and customary return precautions. Patient/family verbalize understanding and are comfortable with this plan.  Outpatient follow-up has been provided as needed. All questions have been answered.    ____________________________________________   FINAL CLINICAL IMPRESSION(S) / ED DIAGNOSES  Final diagnoses:  Kidney stone on left side      ED Discharge Orders         Ordered    oxyCODONE-acetaminophen (PERCOCET) 5-325 MG tablet  Every 6 hours PRN        04/13/20 0105    tamsulosin (FLOMAX) 0.4 MG CAPS capsule  Daily        04/13/20 0105           Note:  This document was prepared using Dragon voice recognition software and may include unintentional dictation errors.    Ward, Layla Maw, DO 04/13/20 0105    Ward, Layla Maw, DO 04/13/20 [phone]

## 2020-04-13 NOTE — Discharge Instructions (Addendum)
You have a 2 mm left-sided kidney stone that is right at the level of your bladder.  The stone has a greater than 80% chance of passing on its own.  It is normal to have fluctuating pain with kidney stones and pain that can take some time to resolve.  Your labs, urine and x-ray today showed no significant change compared to 3 days ago.  You may take 2 oxycodone tablets every 6 hours for pain control.    If you begin having fever of 100.4, vomiting and cannot stop, unable to urinate for more than 8 hours, uncontrolled pain, please return to the emergency department.

## 2020-06-14 ENCOUNTER — Emergency Department
Admission: EM | Admit: 2020-06-14 | Discharge: 2020-06-14 | Disposition: A | Discharge status: Home / Self Care | Payer: BC Managed Care – PPO | Attending: Emergency Medicine | Admitting: Emergency Medicine

## 2020-06-14 ENCOUNTER — Other Ambulatory Visit: Payer: Self-pay

## 2020-06-14 ENCOUNTER — Emergency Department: Payer: BC Managed Care – PPO

## 2020-06-14 ENCOUNTER — Encounter: Payer: Self-pay | Admitting: Emergency Medicine

## 2020-06-14 DIAGNOSIS — M546 Pain in thoracic spine: Secondary | ICD-10-CM | POA: Diagnosis present

## 2020-06-14 DIAGNOSIS — R1013 Epigastric pain: Secondary | ICD-10-CM | POA: Diagnosis not present

## 2020-06-14 LAB — COMPREHENSIVE METABOLIC PANEL
ALT: 193 U/L — ABNORMAL HIGH (ref 0–44)
AST: 85 U/L — ABNORMAL HIGH (ref 15–41)
Albumin: 4.2 g/dL (ref 3.5–5.0)
Alkaline Phosphatase: 81 U/L (ref 38–126)
Anion gap: 7 (ref 5–15)
BUN: 12 mg/dL (ref 6–20)
CO2: 27 mmol/L (ref 22–32)
Calcium: 9 mg/dL (ref 8.9–10.3)
Chloride: 104 mmol/L (ref 98–111)
Creatinine, Ser: 0.96 mg/dL (ref 0.61–1.24)
GFR, Estimated: >60 mL/min (ref >60)
Glucose, Bld: 94 mg/dL (ref 70–99)
Potassium: 3.4 mmol/L — ABNORMAL LOW (ref 3.5–5.1)
Sodium: 138 mmol/L (ref 135–145)
Total Bilirubin: 0.8 mg/dL (ref 0.3–1.2)
Total Protein: 7.5 g/dL (ref 6.5–8.1)

## 2020-06-14 LAB — CBC WITH DIFFERENTIAL/PLATELET
Abs Immature Granulocytes: 0.05 10*3/uL (ref 0.00–0.07)
Basophils Absolute: 0 10*3/uL (ref 0.0–0.1)
Basophils Relative: 0 %
Eosinophils Absolute: 0.1 10*3/uL (ref 0.0–0.5)
Eosinophils Relative: 1 %
HCT: 43.3 % (ref 39.0–52.0)
Hemoglobin: 15.1 g/dL (ref 13.0–17.0)
Immature Granulocytes: 1 %
Lymphocytes Relative: 21 %
Lymphs Abs: 1.9 10*3/uL (ref 0.7–4.0)
MCH: 30.3 pg (ref 26.0–34.0)
MCHC: 34.9 g/dL (ref 30.0–36.0)
MCV: 86.9 fL (ref 80.0–100.0)
Monocytes Absolute: 0.7 10*3/uL (ref 0.1–1.0)
Monocytes Relative: 8 %
Neutro Abs: 6.1 10*3/uL (ref 1.7–7.7)
Neutrophils Relative %: 69 %
Platelets: 266 10*3/uL (ref 150–400)
RBC: 4.98 MIL/uL (ref 4.22–5.81)
RDW: 13.1 % (ref 11.5–15.5)
WBC: 8.8 10*3/uL (ref 4.0–10.5)
nRBC: 0 % (ref 0.0–0.2)

## 2020-06-14 LAB — LIPASE, BLOOD: Lipase: 27 U/L (ref 11–51)

## 2020-06-14 LAB — TROPONIN I (HIGH SENSITIVITY)
Troponin I (High Sensitivity): 3 ng/L (ref <18)
Troponin I (High Sensitivity): <2 ng/L (ref <18)

## 2020-06-14 MED ORDER — MELOXICAM 15 MG PO TABS: 15.0000 mg | ORAL_TABLET | Freq: Every day | ORAL | 2 refills | Status: DC

## 2020-06-14 MED ORDER — KETOROLAC TROMETHAMINE 30 MG/ML IJ SOLN: 30.0000 mg | Freq: Once | INTRAMUSCULAR | Status: DC

## 2020-06-14 MED ORDER — IOHEXOL 350 MG/ML SOLN
100.0000 mL | Freq: Once | INTRAVENOUS | Status: AC | PRN
  Administered 2020-06-14: 100 mL via INTRAVENOUS
  Filled 2020-06-14: qty 100

## 2020-06-14 MED ORDER — METHOCARBAMOL 500 MG PO TABS: 500.0000 mg | ORAL_TABLET | Freq: Three times a day (TID) | ORAL | 0 refills | Status: AC | PRN

## 2020-06-14 MED ORDER — KETOROLAC TROMETHAMINE 30 MG/ML IJ SOLN
30.0000 mg | Freq: Once | INTRAMUSCULAR | Status: AC
  Administered 2020-06-14: 30 mg via INTRAVENOUS
  Filled 2020-06-14: qty 1

## 2020-06-14 NOTE — Discharge Instructions (Signed)
Take Meloxicam and Robaxin as directed.  

## 2020-06-14 NOTE — ED Provider Notes (Signed)
[facility]-EMERGENCY DEPARTMENT  ____________________________________________  Time seen: Approximately 8:37 PM  I have reviewed the triage vital signs and the nursing notes.   HISTORY  Chief Complaint Back Pain   Historian Patient    HPI Kristopher Allen is a 35 y.o. male presents to the emergency department with upper back pain that radiates to the right chest.  Patient states that pain started last night disrupted his sleep.  He denies chest tightness. No reproducible pain with palpation or deep inspiration.  He denies prolonged immobilization, recent surgery or daily smoking. No lower extremity swelling. He states he has never experienced pain like this in the past.  Patient is secondarily concerned for a midline abdominal hernia that has become more noticeable to him.  He states that the pain around hernia is not as bad as pain in his upper back.  Patient denies current shortness of breath.  No recent illness.  No other alleviating measures have been attempted.   Past Medical History:  Diagnosis Date  . GERD (gastroesophageal reflux disease)      Immunizations up to date:  Yes.     Past Medical History:  Diagnosis Date  . GERD (gastroesophageal reflux disease)     Patient Active Problem List   Diagnosis Date Noted  . Acute cholecystitis 11/07/2018    Past Surgical History:  Procedure Laterality Date  . APPENDECTOMY    . CHOLECYSTECTOMY N/A 11/09/2018   Procedure: LAPAROSCOPIC CHOLECYSTECTOMY;  Surgeon: Sung Amabile, DO;  Location: [facility] ORS;  Service: General;  Laterality: N/A;    Prior to Admission medications   Medication Sig Start Date End Date Taking? Authorizing Provider  meloxicam (MOBIC) 15 MG tablet Take 1 tablet (15 mg total) by mouth daily. 06/14/20 06/14/21 Yes Pia Mau M, PA-C  methocarbamol (ROBAXIN) 500 MG tablet Take 1 tablet (500 mg total) by mouth every 8 (eight) hours as needed for up to 5 days. 06/14/20 06/19/20 Yes Pia Mau M, PA-C   ibuprofen (ADVIL) 600 MG tablet Take 1 tablet (600 mg total) by mouth every 8 (eight) hours as needed for moderate pain. 08/23/19   Shaune Pollack, MD  omeprazole (PRILOSEC) 20 MG capsule Take 1 capsule (20 mg total) by mouth daily for 7 days. 08/23/19 08/30/19  Shaune Pollack, MD  ondansetron (ZOFRAN ODT) 4 MG disintegrating tablet Take 1 tablet (4 mg total) by mouth every 8 (eight) hours as needed for nausea or vomiting. 04/10/20   Chesley Noon, MD  oxyCODONE-acetaminophen (PERCOCET) 5-325 MG tablet Take 1 tablet by mouth every 4 (four) hours as needed for severe pain. 04/10/20 04/10/21  Chesley Noon, MD  oxyCODONE-acetaminophen (PERCOCET) 5-325 MG tablet Take 2 tablets by mouth every 6 (six) hours as needed for severe pain. 04/13/20 04/13/21  Ward, Layla Maw, DO  tamsulosin (FLOMAX) 0.4 MG CAPS capsule Take 1 capsule (0.4 mg total) by mouth daily. Take until stone passes. 04/13/20   Ward, Layla Maw, DO    Allergies Patient has no known allergies.  No family history on file.  Social History Social History   Tobacco Use  . Smoking status: Never Smoker  . Smokeless tobacco: Never Used  Vaping Use  . Vaping Use: Never used  Substance Use Topics  . Alcohol use: Never  . Drug use: Never     Review of Systems  Constitutional: No fever/chills Eyes:  No discharge ENT: No upper respiratory complaints. Respiratory: no cough. No SOB/ use of accessory muscles to breath Gastrointestinal:   No nausea, no vomiting.  No diarrhea.  No constipation. Musculoskeletal: Patient has upper back pain that radiates to the chest.  Skin: Negative for rash, abrasions, lacerations, ecchymosis.   ____________________________________________   PHYSICAL EXAM:  VITAL SIGNS: ED Triage Vitals  Enc Vitals Group     BP 06/14/20 1945 (!) 162/127     Pulse Rate 06/14/20 1945 (!) 106     Resp 06/14/20 1945 20     Temp 06/14/20 1945 98.3 F (36.8 C)     Temp Source 06/14/20 1945 Oral     SpO2 06/14/20  1945 100 %     Weight 06/14/20 1946 245 lb (111.1 kg)     Height 06/14/20 1946 6\' 1"  (1.854 m)     Head Circumference --      Peak Flow --      Pain Score 06/14/20 1946 7     Pain Loc --      Pain Edu? --      Excl. in GC? --      Constitutional: Alert and oriented. Well appearing and in no acute distress. Eyes: Conjunctivae are normal. PERRL. EOMI. Head: Atraumatic. ENT:      Nose: No congestion/rhinnorhea.      Mouth/Throat: Mucous membranes are moist.  Neck: No stridor.  No cervical spine tenderness to palpation. Cardiovascular: Normal rate, regular rhythm. Normal S1 and S2.  Good peripheral circulation. Respiratory: Normal respiratory effort without tachypnea or retractions. Lungs CTAB. Good air entry to the bases with no decreased or absent breath sounds Gastrointestinal: Bowel sounds x 4 quadrants. Soft and nontender to palpation. No guarding or rigidity. No distention.  No abdominal hernias palpated. Musculoskeletal: Full range of motion to all extremities. No obvious deformities noted Neurologic:  Normal for age. No gross focal neurologic deficits are appreciated.  Skin:  Skin is warm, dry and intact. No rash noted. Psychiatric: Mood and affect are normal for age. Speech and behavior are normal.   ____________________________________________   LABS (all labs ordered are listed, but only abnormal results are displayed)  Labs Reviewed  COMPREHENSIVE METABOLIC PANEL - Abnormal; Notable for the following components:      Result Value   Potassium 3.4 (*)    AST 85 (*)    ALT 193 (*)    All other components within normal limits  CBC WITH DIFFERENTIAL/PLATELET  LIPASE, BLOOD  TROPONIN I (HIGH SENSITIVITY)  TROPONIN I (HIGH SENSITIVITY)   ____________________________________________  EKG   ____________________________________________  RADIOLOGY 06/16/20, personally viewed and evaluated these images (plain radiographs) as part of my medical decision making,  as well as reviewing the written report by the radiologist.    CT Angio Chest PE W and/or Wo Contrast  Result Date: 06/14/2020 CLINICAL DATA:  Mid back pain which began yesterday, reported hernia as well. EXAM: CT ANGIOGRAPHY CHEST CT ABDOMEN AND PELVIS WITH CONTRAST TECHNIQUE: Multidetector CT imaging of the chest was performed using the standard protocol during bolus administration of intravenous contrast. Multiplanar CT image reconstructions and MIPs were obtained to evaluate the vascular anatomy. Multidetector CT imaging of the abdomen and pelvis was performed using the standard protocol during bolus administration of intravenous contrast. CONTRAST:  06/16/2020 OMNIPAQUE IOHEXOL 350 MG/ML SOLN COMPARISON:  CT 04/10/2020 ultrasound 11/07/2018, CT 05/18/2009 FINDINGS: CTA CHEST FINDINGS Cardiovascular: Satisfactory opacification the pulmonary arteries to the segmental level. No pulmonary artery filling defects are identified. Central pulmonary arteries are normal caliber. Insert aortic root The aorta is normal caliber. No acute luminal abnormality of the imaged aorta.  No periaortic stranding or hemorrhage. Normal 3 vessel branching of the aortic arch. Proximal great vessels are unremarkable. Normal heart size. No pericardial effusion. Mediastinum/Nodes: No mediastinal fluid or gas. Normal thyroid gland and thoracic inlet. No acute abnormality of the trachea or esophagus. No worrisome mediastinal, hilar or axillary adenopathy. Lungs/Pleura: Airways are patent. No consolidation, features of edema, pneumothorax, or effusion. Some minimal atelectatic changes. No suspicious pulmonary nodules or masses. Musculoskeletal: Few scattered Schmorl's nodes and minimal discogenic changes in the thoracic spine. No acute or worrisome osseous abnormalities. No worrisome chest wall mass or lesion. Review of the MIP images confirms the above findings. CT ABDOMEN and PELVIS FINDINGS Hepatobiliary: No worrisome focal liver lesions.  Smooth liver surface contour. Diffuse hepatic hypoattenuation compatible with hepatic steatosis. Prior cholecystectomy. No significant biliary ductal dilatation or intraductal gallstones. Pancreas: No pancreatic ductal dilatation or surrounding inflammatory changes. Spleen: Normal in size. No concerning splenic lesions. Adrenals/Urinary Tract: Normal adrenals. Kidneys are normally located with symmetric enhancement. No suspicious renal lesion, urolithiasis or hydronephrosis. No gross bladder abnormality accounting for slight underdistention. Stomach/Bowel: Distal esophagus, stomach and duodenum are unremarkable. No small bowel thickening or dilatation. No evidence of obstruction. Postsurgical changes from prior appendectomy seen in the right lower quadrant. Vascular/Lymphatic: No significant vascular findings are present. No enlarged abdominal or pelvic lymph nodes. Reproductive: The prostate and seminal vesicles are unremarkable. Other: No abdominopelvic free fluid or free gas. No bowel containing hernias. Bilateral fat containing groin hernias, left greater than right. 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 No spondylolisthesis. No acute osseous abnormality or suspicious osseous lesion. Some asymmetric arthrosis and partial bony fusion across the right SI joint is unchanged from comparison. Review of the MIP images confirms the above findings. IMPRESSION: 1. No evidence of pulmonary embolism. No acute intrathoracic process. 2. No acute intra-abdominal or intrapelvic process. 3. Hepatic steatosis. 4. Prior appendectomy, cholecystectomy. 5. Bilateral fat containing groin hernias, left greater than right. 6. 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 Electronically Signed   By: Kreg ShropshirePrice  DeHay M.D.   On: 06/14/2020 22:18   CT ABDOMEN PELVIS W CONTRAST  Result Date: 06/14/2020 CLINICAL DATA:   Mid back pain which began yesterday, reported hernia as well. EXAM: CT ANGIOGRAPHY CHEST CT ABDOMEN AND PELVIS WITH CONTRAST TECHNIQUE: Multidetector CT imaging of the chest was performed using the standard protocol during bolus administration of intravenous contrast. Multiplanar CT image reconstructions and MIPs were obtained to evaluate the vascular anatomy. Multidetector CT imaging of the abdomen and pelvis was performed using the standard protocol during bolus administration of intravenous contrast. CONTRAST:  100mL OMNIPAQUE IOHEXOL 350 MG/ML SOLN COMPARISON:  CT 04/10/2020 ultrasound 11/07/2018, CT 05/18/2009 FINDINGS: CTA CHEST FINDINGS Cardiovascular: Satisfactory opacification the pulmonary arteries to the segmental level. No pulmonary artery filling defects are identified. Central pulmonary arteries are normal caliber. Insert aortic root The aorta is normal caliber. No acute luminal abnormality of the imaged aorta. No periaortic stranding or hemorrhage. Normal 3 vessel branching of the aortic arch. Proximal great vessels are unremarkable. Normal heart size. No pericardial effusion. Mediastinum/Nodes: No mediastinal fluid or gas. Normal thyroid gland and thoracic inlet. No acute abnormality of the trachea or esophagus. No worrisome mediastinal, hilar or axillary adenopathy. Lungs/Pleura: Airways are patent. No consolidation, features of edema, pneumothorax, or effusion. Some minimal atelectatic changes. No suspicious pulmonary nodules or masses. Musculoskeletal: Few scattered Schmorl's nodes and  minimal discogenic changes in the thoracic spine. No acute or worrisome osseous abnormalities. No worrisome chest wall mass or lesion. Review of the MIP images confirms the above findings. CT ABDOMEN and PELVIS FINDINGS Hepatobiliary: No worrisome focal liver lesions. Smooth liver surface contour. Diffuse hepatic hypoattenuation compatible with hepatic steatosis. Prior cholecystectomy. No significant biliary ductal  dilatation or intraductal gallstones. Pancreas: No pancreatic ductal dilatation or surrounding inflammatory changes. Spleen: Normal in size. No concerning splenic lesions. Adrenals/Urinary Tract: Normal adrenals. Kidneys are normally located with symmetric enhancement. No suspicious renal lesion, urolithiasis or hydronephrosis. No gross bladder abnormality accounting for slight underdistention. Stomach/Bowel: Distal esophagus, stomach and duodenum are unremarkable. No small bowel thickening or dilatation. No evidence of obstruction. Postsurgical changes from prior appendectomy seen in the right lower quadrant. Vascular/Lymphatic: No significant vascular findings are present. No enlarged abdominal or pelvic lymph nodes. Reproductive: The prostate and seminal vesicles are unremarkable. Other: No abdominopelvic free fluid or free gas. No bowel containing hernias. Bilateral fat containing groin hernias, left greater than right. 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 No spondylolisthesis. No acute osseous abnormality or suspicious osseous lesion. Some asymmetric arthrosis and partial bony fusion across the right SI joint is unchanged from comparison. Review of the MIP images confirms the above findings. IMPRESSION: 1. No evidence of pulmonary embolism. No acute intrathoracic process. 2. No acute intra-abdominal or intrapelvic process. 3. Hepatic steatosis. 4. Prior appendectomy, cholecystectomy. 5. Bilateral fat containing groin hernias, left greater than right. 6. 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 Electronically Signed   By: Kreg Shropshire M.D.   On: 06/14/2020 22:18    ____________________________________________    PROCEDURES  Procedure(s) performed:     Procedures     Medications  iohexol (OMNIPAQUE) 350 MG/ML injection 100 mL (100 mLs Intravenous Contrast Given 06/14/20  2156)  ketorolac (TORADOL) 30 MG/ML injection 30 mg (30 mg Intravenous Given 06/14/20 2238)     ____________________________________________   INITIAL IMPRESSION / ASSESSMENT AND PLAN / ED COURSE  Pertinent labs & imaging results that were available during my care of the patient were reviewed by me and considered in my medical decision making (see chart for details).      Assessment and Plan: Upper back pain:  Abdominal discomfort 35 year old male presents to the emergency department with upper back pain that radiates to the right chest for the past 2 days.  Vital signs were reassuring at triage.  On physical exam, patient was alert, active and nontoxic-appearing with no increased work of breathing.  His pain could not be reproduced with deep inspiration or with palpation.  Differential diagnosis included PE, pneumothorax, STEMI, musculoskeletal pain, abdominal hernia..  Patient was  Tachycardic at triage and given [facility] rules, cannot rule out PE..  AST and ALT were elevated on CMP but was otherwise reassuring.  CBC was within reference range.  Lipase was within reference range.  Troponin was within reference range.  EKG showed normal sinus rhythm without ST segment elevation or other apparent arrhythmia.  CT angio chest showed no signs of PE or other acute abnormality.  No signs of abdominal hernia on CT abdomen and pelvis.  Patient was given IV Toradol prior to discharge.  He was discharged with meloxicam and Robaxin.  Return precautions were given to return with new or worsening symptoms.     ____________________________________________  FINAL CLINICAL IMPRESSION(S) / ED DIAGNOSES  Final diagnoses:  Acute midline thoracic back pain  Epigastric pain      NEW MEDICATIONS STARTED DURING THIS VISIT:  ED Discharge Orders         Ordered    meloxicam (MOBIC) 15 MG tablet  Daily        06/14/20 2231    methocarbamol (ROBAXIN) 500 MG tablet  Every 8 hours PRN         06/14/20 2231              This chart was dictated using voice recognition software/Dragon. Despite best efforts to proofread, errors can occur which can change the meaning. Any change was purely unintentional.     Orvil Feil, PA-C 06/14/20 2320    Gilles Chiquito, MD 06/15/20 [phone]

## 2020-06-14 NOTE — ED Triage Notes (Signed)
Pt presents to ER from home with complaints of middle back pain that started yesterday, pt denies any injuries or strenuous activity besides his normal lifting at work. Pt also reports he has a hernia in the middle of his stomach and wants to check it, pt denies any pain, denies any N/V/D. Pt talks in complete sentences, no respiratory distress noted

## 2020-07-07 ENCOUNTER — Encounter: Payer: Self-pay | Admitting: Emergency Medicine

## 2020-07-07 ENCOUNTER — Emergency Department
Admission: EM | Admit: 2020-07-07 | Discharge: 2020-07-07 | Disposition: A | Discharge status: Home / Self Care | Payer: BC Managed Care – PPO | Attending: Emergency Medicine | Admitting: Emergency Medicine

## 2020-07-07 ENCOUNTER — Emergency Department: Payer: BC Managed Care – PPO

## 2020-07-07 ENCOUNTER — Emergency Department
Admission: EM | Admit: 2020-07-07 | Discharge: 2020-07-07 | Disposition: A | Discharge status: Home / Self Care | Payer: BC Managed Care – PPO | Source: Home / Self Care | Attending: Emergency Medicine | Admitting: Emergency Medicine

## 2020-07-07 ENCOUNTER — Other Ambulatory Visit: Payer: Self-pay

## 2020-07-07 DIAGNOSIS — R110 Nausea: Secondary | ICD-10-CM | POA: Diagnosis not present

## 2020-07-07 DIAGNOSIS — R002 Palpitations: Secondary | ICD-10-CM | POA: Diagnosis not present

## 2020-07-07 DIAGNOSIS — Z20822 Contact with and (suspected) exposure to covid-19: Secondary | ICD-10-CM | POA: Insufficient documentation

## 2020-07-07 DIAGNOSIS — R079 Chest pain, unspecified: Secondary | ICD-10-CM

## 2020-07-07 HISTORY — DX: Unspecified convulsions: R56.9

## 2020-07-07 LAB — CBC WITH DIFFERENTIAL/PLATELET
Abs Immature Granulocytes: 0.03 10*3/uL (ref 0.00–0.07)
Abs Immature Granulocytes: 0.03 10*3/uL (ref 0.00–0.07)
Basophils Absolute: 0 10*3/uL (ref 0.0–0.1)
Basophils Absolute: 0 10*3/uL (ref 0.0–0.1)
Basophils Relative: 0 %
Basophils Relative: 0 %
Eosinophils Absolute: 0.1 10*3/uL (ref 0.0–0.5)
Eosinophils Absolute: 0.1 10*3/uL (ref 0.0–0.5)
Eosinophils Relative: 1 %
Eosinophils Relative: 1 %
HCT: 39.6 % (ref 39.0–52.0)
HCT: 43.7 % (ref 39.0–52.0)
Hemoglobin: 14.2 g/dL (ref 13.0–17.0)
Hemoglobin: 15.6 g/dL (ref 13.0–17.0)
Immature Granulocytes: 0 %
Immature Granulocytes: 0 %
Lymphocytes Relative: 21 %
Lymphocytes Relative: 24 %
Lymphs Abs: 1.8 10*3/uL (ref 0.7–4.0)
Lymphs Abs: 1.9 10*3/uL (ref 0.7–4.0)
MCH: 30.9 pg (ref 26.0–34.0)
MCH: 30.7 pg (ref 26.0–34.0)
MCHC: 35.9 g/dL (ref 30.0–36.0)
MCHC: 35.7 g/dL (ref 30.0–36.0)
MCV: 86.1 fL (ref 80.0–100.0)
MCV: 86 fL (ref 80.0–100.0)
Monocytes Absolute: 0.7 10*3/uL (ref 0.1–1.0)
Monocytes Absolute: 0.6 10*3/uL (ref 0.1–1.0)
Monocytes Relative: 8 %
Monocytes Relative: 7 %
Neutro Abs: 5.9 10*3/uL (ref 1.7–7.7)
Neutro Abs: 5.4 10*3/uL (ref 1.7–7.7)
Neutrophils Relative %: 70 %
Neutrophils Relative %: 68 %
Platelets: 246 10*3/uL (ref 150–400)
Platelets: 260 10*3/uL (ref 150–400)
RBC: 4.6 MIL/uL (ref 4.22–5.81)
RBC: 5.08 MIL/uL (ref 4.22–5.81)
RDW: 13.1 % (ref 11.5–15.5)
RDW: 13.2 % (ref 11.5–15.5)
WBC: 8.6 10*3/uL (ref 4.0–10.5)
WBC: 7.9 10*3/uL (ref 4.0–10.5)
nRBC: 0 % (ref 0.0–0.2)
nRBC: 0 % (ref 0.0–0.2)

## 2020-07-07 LAB — COMPREHENSIVE METABOLIC PANEL
ALT: 145 U/L — ABNORMAL HIGH (ref 0–44)
ALT: 160 U/L — ABNORMAL HIGH (ref 0–44)
AST: 69 U/L — ABNORMAL HIGH (ref 15–41)
AST: 77 U/L — ABNORMAL HIGH (ref 15–41)
Albumin: 4.2 g/dL (ref 3.5–5.0)
Albumin: 4.8 g/dL (ref 3.5–5.0)
Alkaline Phosphatase: 77 U/L (ref 38–126)
Alkaline Phosphatase: 86 U/L (ref 38–126)
Anion gap: 7 (ref 5–15)
Anion gap: 9 (ref 5–15)
BUN: 17 mg/dL (ref 6–20)
BUN: 15 mg/dL (ref 6–20)
CO2: 26 mmol/L (ref 22–32)
CO2: 23 mmol/L (ref 22–32)
Calcium: 8.8 mg/dL — ABNORMAL LOW (ref 8.9–10.3)
Calcium: 9.2 mg/dL (ref 8.9–10.3)
Chloride: 107 mmol/L (ref 98–111)
Chloride: 106 mmol/L (ref 98–111)
Creatinine, Ser: 0.96 mg/dL (ref 0.61–1.24)
Creatinine, Ser: 0.85 mg/dL (ref 0.61–1.24)
GFR, Estimated: >60 mL/min (ref >60)
GFR, Estimated: >60 mL/min (ref >60)
Glucose, Bld: 100 mg/dL — ABNORMAL HIGH (ref 70–99)
Glucose, Bld: 96 mg/dL (ref 70–99)
Potassium: 3.6 mmol/L (ref 3.5–5.1)
Potassium: 3.5 mmol/L (ref 3.5–5.1)
Sodium: 140 mmol/L (ref 135–145)
Sodium: 138 mmol/L (ref 135–145)
Total Bilirubin: 0.7 mg/dL (ref 0.3–1.2)
Total Bilirubin: 1.1 mg/dL (ref 0.3–1.2)
Total Protein: 7.3 g/dL (ref 6.5–8.1)
Total Protein: 8.4 g/dL — ABNORMAL HIGH (ref 6.5–8.1)

## 2020-07-07 LAB — URINE DRUG SCREEN, QUALITATIVE (ARMC ONLY)
Amphetamines, Ur Screen: NOT DETECTED
Barbiturates, Ur Screen: NOT DETECTED
Benzodiazepine, Ur Scrn: NOT DETECTED
Cannabinoid 50 Ng, Ur ~~LOC~~: NOT DETECTED
Cocaine Metabolite,Ur ~~LOC~~: NOT DETECTED
MDMA (Ecstasy)Ur Screen: NOT DETECTED
Methadone Scn, Ur: NOT DETECTED
Opiate, Ur Screen: NOT DETECTED
Phencyclidine (PCP) Ur S: NOT DETECTED
Tricyclic, Ur Screen: NOT DETECTED

## 2020-07-07 LAB — URINALYSIS, COMPLETE (UACMP) WITH MICROSCOPIC
Bacteria, UA: NONE SEEN
Bilirubin Urine: NEGATIVE
Glucose, UA: NEGATIVE mg/dL
Hgb urine dipstick: NEGATIVE
Ketones, ur: NEGATIVE mg/dL
Leukocytes,Ua: NEGATIVE
Nitrite: NEGATIVE
Protein, ur: NEGATIVE mg/dL
Specific Gravity, Urine: 1.003 — ABNORMAL LOW (ref 1.005–1.030)
Squamous Epithelial / HPF: NONE SEEN (ref 0–5)
pH: 7 (ref 5.0–8.0)

## 2020-07-07 LAB — TROPONIN I (HIGH SENSITIVITY)
Troponin I (High Sensitivity): 4 ng/L (ref <18)
Troponin I (High Sensitivity): 4 ng/L (ref <18)
Troponin I (High Sensitivity): 3 ng/L (ref <18)

## 2020-07-07 LAB — SARS CORONAVIRUS 2 (TAT 6-24 HRS): SARS Coronavirus 2: NEGATIVE

## 2020-07-07 LAB — TSH: TSH: 2.348 u[IU]/mL (ref 0.350–4.500)

## 2020-07-07 LAB — LIPASE, BLOOD: Lipase: 27 U/L (ref 11–51)

## 2020-07-07 LAB — T4, FREE: Free T4: 0.94 ng/dL (ref 0.61–1.12)

## 2020-07-07 MED ORDER — DIAZEPAM 2 MG PO TABS: 2.0000 mg | ORAL_TABLET | Freq: Once | ORAL | Status: DC

## 2020-07-07 MED ORDER — FAMOTIDINE 40 MG PO TABS: 40.0000 mg | ORAL_TABLET | Freq: Every evening | ORAL | 1 refills | Status: DC

## 2020-07-07 MED ORDER — LIDOCAINE VISCOUS HCL 2 % MT SOLN
15.0000 mL | Freq: Once | OROMUCOSAL | Status: AC
  Administered 2020-07-07: 15 mL via ORAL
  Filled 2020-07-07: qty 15

## 2020-07-07 MED ORDER — ALUM & MAG HYDROXIDE-SIMETH 200-200-20 MG/5ML PO SUSP
30.0000 mL | Freq: Once | ORAL | Status: AC
  Administered 2020-07-07: 30 mL via ORAL
  Filled 2020-07-07: qty 30

## 2020-07-07 MED ORDER — FAMOTIDINE 20 MG PO TABS
40.0000 mg | ORAL_TABLET | Freq: Once | ORAL | Status: AC
  Administered 2020-07-07: 40 mg via ORAL
  Filled 2020-07-07: qty 2

## 2020-07-07 NOTE — Discharge Instructions (Signed)
Reduce caffeine whenever possible.  Return to the ER for worsening symptoms, persistent vomiting, difficulty breathing or other concerns.

## 2020-07-07 NOTE — ED Provider Notes (Signed)
[facility]-EMERGENCY DEPARTMENT  ____________________________________________  Time seen: Approximately 3:41 PM  I have reviewed the triage vital signs and the nursing notes.   HISTORY  Chief Complaint Chest Pain   Historian Patient   HPI Kristopher Allen is a 35 y.o. male with a history of GERD presents to the emergency department with constant chest heaviness.  Patient was seen and evaluated at 1 AM today by this emergency department and had a comprehensive cardiac work-up which was unremarkable.  Patient states that his pain has not changed since prior evaluation but it is still present which concerns him.  Patient states that he has an unremarkable cardiac history and takes no medications for hypertension.  He states that he has had some nausea with his discomfort but no abdominal pain.  He denies shortness of breath or chest tightness. No cough.  States that he does not feel particularly anxious right now.  He has been afebrile at home but does have concerns for COVID-19. He denies daily smoking.    Past Medical History:  Diagnosis Date  . GERD (gastroesophageal reflux disease)   . Seizures (HCC)    states when he was younger     Immunizations up to date:  Yes.     Past Medical History:  Diagnosis Date  . GERD (gastroesophageal reflux disease)   . Seizures (HCC)    states when he was younger    Patient Active Problem List   Diagnosis Date Noted  . Acute cholecystitis 11/07/2018    Past Surgical History:  Procedure Laterality Date  . APPENDECTOMY    . CHOLECYSTECTOMY N/A 11/09/2018   Procedure: LAPAROSCOPIC CHOLECYSTECTOMY;  Surgeon: Sung Amabile, DO;  Location: [facility] ORS;  Service: General;  Laterality: N/A;    Prior to Admission medications   Medication Sig Start Date End Date Taking? Authorizing Provider  famotidine (PEPCID) 40 MG tablet Take 1 tablet (40 mg total) by mouth every evening. 07/07/20 07/07/21 Yes Pia Mau M, PA-C  ibuprofen (ADVIL) 600 MG  tablet Take 1 tablet (600 mg total) by mouth every 8 (eight) hours as needed for moderate pain. 08/23/19   Shaune Pollack, MD  meloxicam (MOBIC) 15 MG tablet Take 1 tablet (15 mg total) by mouth daily. 06/14/20 06/14/21  Orvil Feil, PA-C  omeprazole (PRILOSEC) 20 MG capsule Take 1 capsule (20 mg total) by mouth daily for 7 days. 08/23/19 08/30/19  Shaune Pollack, MD  ondansetron (ZOFRAN ODT) 4 MG disintegrating tablet Take 1 tablet (4 mg total) by mouth every 8 (eight) hours as needed for nausea or vomiting. 04/10/20   Chesley Noon, MD  oxyCODONE-acetaminophen (PERCOCET) 5-325 MG tablet Take 1 tablet by mouth every 4 (four) hours as needed for severe pain. 04/10/20 04/10/21  Chesley Noon, MD  oxyCODONE-acetaminophen (PERCOCET) 5-325 MG tablet Take 2 tablets by mouth every 6 (six) hours as needed for severe pain. 04/13/20 04/13/21  Ward, Layla Maw, DO  tamsulosin (FLOMAX) 0.4 MG CAPS capsule Take 1 capsule (0.4 mg total) by mouth daily. Take until stone passes. 04/13/20   Ward, Layla Maw, DO    Allergies Patient has no known allergies.  No family history on file.  Social History Social History   Tobacco Use  . Smoking status: Never Smoker  . Smokeless tobacco: Never Used  Vaping Use  . Vaping Use: Never used  Substance Use Topics  . Alcohol use: Never  . Drug use: Never     Review of Systems  Constitutional: No fever/chills Eyes:  No discharge  ENT: No upper respiratory complaints. Respiratory: no cough. No SOB/ use of accessory muscles to breath Gastrointestinal:   No nausea, no vomiting.  No diarrhea.  No constipation. Musculoskeletal: Negative for musculoskeletal pain. Skin: Negative for rash, abrasions, lacerations, ecchymosis.    ____________________________________________   PHYSICAL EXAM:  VITAL SIGNS: ED Triage Vitals  Enc Vitals Group     BP 07/07/20 1527 134/89     Pulse Rate 07/07/20 1527 93     Resp 07/07/20 1527 18     Temp 07/07/20 1527 98.7 F (37.1 C)      Temp Source 07/07/20 1527 Oral     SpO2 07/07/20 1527 100 %     Weight 07/07/20 1527 250 lb (113.4 kg)     Height 07/07/20 1527 6' (1.829 m)     Head Circumference --      Peak Flow --      Pain Score 07/07/20 1529 5     Pain Loc --      Pain Edu? --      Excl. in GC? --      Constitutional: Alert and oriented. Well appearing and in no acute distress. Eyes: Conjunctivae are normal. PERRL. EOMI. Head: Atraumatic. ENT:      Nose: No congestion/rhinnorhea.      Mouth/Throat: Mucous membranes are moist.  Neck: No stridor.  No cervical spine tenderness to palpation. Cardiovascular: Normal rate, regular rhythm. Normal S1 and S2.  Good peripheral circulation. Respiratory: Normal respiratory effort without tachypnea or retractions. Lungs CTAB. Good air entry to the bases with no decreased or absent breath sounds Gastrointestinal: Bowel sounds x 4 quadrants. Soft and nontender to palpation. No guarding or rigidity. No distention. Musculoskeletal: Full range of motion to all extremities. No obvious deformities noted Neurologic:  Normal for age. No gross focal neurologic deficits are appreciated.  Skin:  Skin is warm, dry and intact. No rash noted. Psychiatric: Mood and affect are normal for age. Speech and behavior are normal.   ____________________________________________   LABS (all labs ordered are listed, but only abnormal results are displayed)  Labs Reviewed  COMPREHENSIVE METABOLIC PANEL - Abnormal; Notable for the following components:      Result Value   Total Protein 8.4 (*)    AST 77 (*)    ALT 160 (*)    All other components within normal limits  SARS CORONAVIRUS 2 (TAT 6-24 HRS)  CBC WITH DIFFERENTIAL/PLATELET  LIPASE, BLOOD  TROPONIN I (HIGH SENSITIVITY)  TROPONIN I (HIGH SENSITIVITY)   ____________________________________________  EKG   ____________________________________________  RADIOLOGY Geraldo Pitter, personally viewed and evaluated these  images (plain radiographs) as part of my medical decision making, as well as reviewing the written report by the radiologist.  DG Chest Port 1 View  Result Date: 07/07/2020 CLINICAL DATA:  Palpitations EXAM: PORTABLE CHEST 1 VIEW COMPARISON:  CT 06/14/2020 FINDINGS: The heart size and mediastinal contours are within normal limits. Both lungs are clear. The visualized skeletal structures are unremarkable. IMPRESSION: No active disease. Electronically Signed   By: Helyn Numbers MD   On: 07/07/2020 01:21    ____________________________________________    PROCEDURES  Procedure(s) performed:     Procedures     Medications  alum & mag hydroxide-simeth (MAALOX/MYLANTA) 200-200-20 MG/5ML suspension 30 mL (30 mLs Oral Given 07/07/20 1555)    And  lidocaine (XYLOCAINE) 2 % viscous mouth solution 15 mL (15 mLs Oral Given 07/07/20 1555)  famotidine (PEPCID) tablet 40 mg (40 mg Oral Given 07/07/20  1554)     ____________________________________________   INITIAL IMPRESSION / ASSESSMENT AND PLAN / ED COURSE  Pertinent labs & imaging results that were available during my care of the patient were reviewed by me and considered in my medical decision making (see chart for details).      Assessment and Plan:  Chest heaviness 35 year old male with an unremarkable past medical history presents to the emergency department with chest heaviness persistent since prior evaluation at 1 AM today.  Vital signs are reassuring at triage.  On exam, patient was in a supine position and appeared to be resting comfortably.  He was interactive and was able to speak in complete sentences.  He had no adventitious lung sounds on exam.  Abdomen was soft and nontender without guarding.  Differential diagnosis includes GERD, anxiety, COVID-19, PE...  Given history of GERD and nonspecific chest heaviness, GERD is leading differential.  Patient does not seem particularly anxious right now and denies stressors in  his life.  Will send off COVID-19 test.  Patient has a PERC score of 0 and therefore PE is low on differential.  We will administer GI cocktail and will reassess.  Repeat CBC, CMP, troponin and lipase were within reference range.  Patient felt much better after GI cocktail increasing suspicion for GERD as source of discomfort.  Patient was discharged with Pepcid.  All patient questions were answered.   ____________________________________________  FINAL CLINICAL IMPRESSION(S) / ED DIAGNOSES  Final diagnoses:  Chest pain, unspecified type      NEW MEDICATIONS STARTED DURING THIS VISIT:  ED Discharge Orders         Ordered    famotidine (PEPCID) 40 MG tablet  Every evening        07/07/20 1650              This chart was dictated using voice recognition software/Dragon. Despite best efforts to proofread, errors can occur which can change the meaning. Any change was purely unintentional.     Gasper Lloyd 07/07/20 2039    Chesley Noon, MD 07/08/20 1701

## 2020-07-07 NOTE — ED Notes (Addendum)
Pt present to the ED for centralzied CP that started after work yesterday. Pt states that it radiates down his upper abd. Pt states he has been nauseous also. Denies SOB. Pt has hx GERD.

## 2020-07-07 NOTE — ED Provider Notes (Signed)
[facility] Emergency Department Provider Note   ____________________________________________   Event Date/Time   First MD Initiated Contact with Patient 07/07/20 0050     (approximate)  I have reviewed the triage vital signs and the nursing notes.   HISTORY  Chief Complaint Palpitations (Starting tonight, states he's been under a lot of stress recently. Denies CP. -n/v. Denies excessive caffeine intake. Denies drug/ETOH use tonight)    HPI Kristopher Allen is a 35 y.o. male brought to the ED via EMS from home with a chief complaint of palpitations.  Patient works 12-hour shifts and states he has been having off/on palpitations all day.  Got off of work around 8 PM, drinking beer, was getting ready for bed when palpitations returned.  Admits increased life stressors recently.  Denies associated fever, cough, chest pain, shortness of breath, abdominal pain, nausea, vomiting or dizziness.  Denies excessive caffeine intake or energy drinks.  Denies recent travel, trauma or hormone use.     Past Medical History:  Diagnosis Date  . GERD (gastroesophageal reflux disease)   . Seizures (HCC)    states when he was younger    Patient Active Problem List   Diagnosis Date Noted  . Acute cholecystitis 11/07/2018    Past Surgical History:  Procedure Laterality Date  . APPENDECTOMY    . CHOLECYSTECTOMY N/A 11/09/2018   Procedure: LAPAROSCOPIC CHOLECYSTECTOMY;  Surgeon: Nathaneal Sommers Amabile, DO;  Location: [facility] ORS;  Service: General;  Laterality: N/A;    Prior to Admission medications   Medication Sig Start Date End Date Taking? Authorizing Provider  ibuprofen (ADVIL) 600 MG tablet Take 1 tablet (600 mg total) by mouth every 8 (eight) hours as needed for moderate pain. 08/23/19   Shaune Pollack, MD  meloxicam (MOBIC) 15 MG tablet Take 1 tablet (15 mg total) by mouth daily. 06/14/20 06/14/21  Orvil Feil, PA-C  omeprazole (PRILOSEC) 20 MG capsule Take 1 capsule (20  mg total) by mouth daily for 7 days. 08/23/19 08/30/19  Shaune Pollack, MD  ondansetron (ZOFRAN ODT) 4 MG disintegrating tablet Take 1 tablet (4 mg total) by mouth every 8 (eight) hours as needed for nausea or vomiting. 04/10/20   Chesley Noon, MD  oxyCODONE-acetaminophen (PERCOCET) 5-325 MG tablet Take 1 tablet by mouth every 4 (four) hours as needed for severe pain. 04/10/20 04/10/21  Chesley Noon, MD  oxyCODONE-acetaminophen (PERCOCET) 5-325 MG tablet Take 2 tablets by mouth every 6 (six) hours as needed for severe pain. 04/13/20 04/13/21  Ward, Layla Maw, DO  tamsulosin (FLOMAX) 0.4 MG CAPS capsule Take 1 capsule (0.4 mg total) by mouth daily. Take until stone passes. 04/13/20   Ward, Layla Maw, DO    Allergies Patient has no known allergies.  History reviewed. No pertinent family history.  Social History Social History   Tobacco Use  . Smoking status: Never Smoker  . Smokeless tobacco: Never Used  Vaping Use  . Vaping Use: Never used  Substance Use Topics  . Alcohol use: Never  . Drug use: Never    Review of Systems  Constitutional: No fever/chills Eyes: No visual changes. ENT: No sore throat. Cardiovascular: Positive for palpitations.  Denies chest pain. Respiratory: Denies shortness of breath. Gastrointestinal: No abdominal pain.  No nausea, no vomiting.  No diarrhea.  No constipation. Genitourinary: Negative for dysuria. Musculoskeletal: Negative for back pain. Skin: Negative for rash. Neurological: Negative for headaches, focal weakness or numbness.   ____________________________________________   PHYSICAL EXAM:  VITAL SIGNS: ED Triage  Vitals  Enc Vitals Group     BP 07/07/20 0048 134/88     Pulse Rate 07/07/20 0048 95     Resp 07/07/20 0048 15     Temp --      Temp Source 07/07/20 0048 Oral     SpO2 07/07/20 0048 100 %     Weight 07/07/20 0049 250 lb (113.4 kg)     Height 07/07/20 0049 6' (1.829 m)     Head Circumference --      Peak Flow --      Pain  Score 07/07/20 0049 0     Pain Loc --      Pain Edu? --      Excl. in GC? --     Constitutional: Alert and oriented. Well appearing and in no acute distress. Eyes: Conjunctivae are normal. PERRL. EOMI. Head: Atraumatic. Nose: No congestion/rhinnorhea. Mouth/Throat: Mucous membranes are moist.   Neck: No stridor.  No thyromegaly. Cardiovascular: Normal rate, regular rhythm. Grossly normal heart sounds.  Good peripheral circulation. Respiratory: Normal respiratory effort.  No retractions. Lungs CTAB. Gastrointestinal: Soft and nontender. No distention. No abdominal bruits. No CVA tenderness. Musculoskeletal: No lower extremity tenderness nor edema.  No joint effusions. Neurologic:  Normal speech and language. No gross focal neurologic deficits are appreciated. No gait instability. Skin:  Skin is warm, dry and intact. No rash noted. Psychiatric: Mood and affect are normal. Speech and behavior are normal.  ____________________________________________   LABS (all labs ordered are listed, but only abnormal results are displayed)  Labs Reviewed  COMPREHENSIVE METABOLIC PANEL - Abnormal; Notable for the following components:      Result Value   Glucose, Bld 100 (*)    Calcium 8.8 (*)    AST 69 (*)    ALT 145 (*)    All other components within normal limits  URINALYSIS, COMPLETE (UACMP) WITH MICROSCOPIC - Abnormal; Notable for the following components:   Color, Urine STRAW (*)    APPearance CLEAR (*)    Specific Gravity, Urine 1.003 (*)    All other components within normal limits  CBC WITH DIFFERENTIAL/PLATELET  URINE DRUG SCREEN, QUALITATIVE ([facility] ONLY)  TSH  T4, FREE  TROPONIN I (HIGH SENSITIVITY)  TROPONIN I (HIGH SENSITIVITY)   ____________________________________________  EKG  ED ECG REPORT I, Sieanna Vanstone J, the attending physician, personally viewed and interpreted this ECG.   Date: 07/07/2020  EKG Time: 0054  Rate: 81  Rhythm: normal EKG, normal sinus rhythm   Axis: Normal  Intervals:none  ST&T Change: Nonspecific  ____________________________________________  RADIOLOGY I, Evangelos Paulino J, personally viewed and evaluated these images (plain radiographs) as part of my medical decision making, as well as reviewing the written report by the radiologist.  ED MD interpretation: No acute cardiopulmonary process  Official radiology report(s): DG Chest Port 1 View  Result Date: 07/07/2020 CLINICAL DATA:  Palpitations EXAM: PORTABLE CHEST 1 VIEW COMPARISON:  CT 06/14/2020 FINDINGS: The heart size and mediastinal contours are within normal limits. Both lungs are clear. The visualized skeletal structures are unremarkable. IMPRESSION: No active disease. Electronically Signed   By: Helyn Numbers MD   On: 07/07/2020 01:21    ____________________________________________   PROCEDURES  Procedure(s) performed (including Critical Care):  .1-3 Lead EKG Interpretation Performed by: Irean Hong, MD Authorized by: Irean Hong, MD     Interpretation: normal     ECG rate:  95   ECG rate assessment: normal     Rhythm: sinus rhythm  Ectopy: none     Conduction: normal   Comments:     Patient placed on cardiac monitor to evaluate for arrhythmias     ____________________________________________   INITIAL IMPRESSION / ASSESSMENT AND PLAN / ED COURSE  As part of my medical decision making, I reviewed the following data within the electronic MEDICAL RECORD NUMBER Nursing notes reviewed and incorporated, Labs reviewed, EKG interpreted, Old chart reviewed, Radiograph reviewed and Notes from prior ED visits     35 year old male presenting with heart palpitations. Differential diagnosis includes, but is not limited to, ACS, aortic dissection, pulmonary embolism, cardiac tamponade, pneumothorax, pneumonia, pericarditis, myocarditis, GI-related causes including esophagitis/gastritis, and musculoskeletal chest wall pain.    We will obtain cardiac panel including  thyroid function.  Will reassess.  Clinical Course as of 07/07/20 0411  Mon Jul 07, 2020  0174 Updated patient on all test results.  Will refer to cardiology for outpatient follow-up as needed.  Strict return precautions given.  Patient verbalizes understanding agrees with plan of care. [JS]    Clinical Course User Index [JS] Irean Hong, MD     ____________________________________________   FINAL CLINICAL IMPRESSION(S) / ED DIAGNOSES  Final diagnoses:  Heart palpitations     ED Discharge Orders    None      *Please note:  ESTANISLADO SURGEON was evaluated in Emergency Department on 07/07/2020 for the symptoms described in the history of present illness. He was evaluated in the context of the global COVID-19 pandemic, which necessitated consideration that the patient might be at risk for infection with the SARS-CoV-2 virus that causes COVID-19. Institutional protocols and algorithms that pertain to the evaluation of patients at risk for COVID-19 are in a state of rapid change based on information released by regulatory bodies including the CDC and federal and state organizations. These policies and algorithms were followed during the patient's care in the ED.  Some ED evaluations and interventions may be delayed as a result of limited staffing during and the pandemic.*   Note:  This document was prepared using Dragon voice recognition software and may include unintentional dictation errors.   Irean Hong, MD 07/07/20 [phone]

## 2020-07-07 NOTE — ED Triage Notes (Signed)
C/O chest discomfort.  States was seen last night for same.    AAOx3.  Skin warm and dry. NAD

## 2020-07-07 NOTE — ED Notes (Signed)
Chief Complaint  Patient presents with  . Palpitations    Starting tonight, states he's been under a lot of stress recently. Denies CP. -n/v. Denies excessive caffeine intake. Denies drug/ETOH use tonight   Pt presenting via EMS for the above complaint. States palpitations started around 8pm. Pt reports he thinks he has anxiety but has never been diagnosed. Denies pain. Pt gowned, placed on monitor. AAO NAD VSS

## 2020-07-07 NOTE — Discharge Instructions (Addendum)
Take 40 mg of Pepcid once daily for acid reflux.

## 2020-07-14 ENCOUNTER — Ambulatory Visit (INDEPENDENT_AMBULATORY_CARE_PROVIDER_SITE_OTHER): Payer: BC Managed Care – PPO | Admitting: Cardiology

## 2020-07-14 ENCOUNTER — Other Ambulatory Visit: Payer: Self-pay

## 2020-07-14 ENCOUNTER — Encounter: Payer: Self-pay | Admitting: Cardiology

## 2020-07-14 VITALS — BP 110/72 | HR 85 | Ht 72.0 in | Wt 248.0 lb

## 2020-07-14 DIAGNOSIS — K2100 Gastro-esophageal reflux disease with esophagitis, without bleeding: Secondary | ICD-10-CM

## 2020-07-14 DIAGNOSIS — R079 Chest pain, unspecified: Secondary | ICD-10-CM | POA: Diagnosis not present

## 2020-07-14 NOTE — Patient Instructions (Signed)

## 2020-07-14 NOTE — Progress Notes (Signed)
Cardiology Office Note:    Date:  07/14/2020   ID:  [name], DOB [birth year], MRN [id number]  PCP:  Patient, No Pcp Per (Inactive)   [facility]  Cardiologist:  Debbe Odea, MD  Advanced Practice Provider:  No care team member to display Electrophysiologist:  None       Referring MD: No ref. provider found   Chief Complaint  Patient presents with  . New Patient (Initial Visit)    Referred by ED for chest pain- Patient states it was just GERD. Meds reviewed verbally with patient.     History of Present Illness:    Kristopher Allen is a 35 y.o. male with a hx of reflux who presents due to chest pain.  Patient was about to go to sleep a week ago when he developed central chest discomfort and rapid heart rates.  He denies any prior occurrence.  Describes pain as sharp.  He called EMS and was taken to the emergency room.  EKG and troponins did not reveal any acute ischemia.  He was given Pepcid which he took once with resolution of symptoms.  He denies any further episodes.  He denies any history of heart disease, smoking, family history of heart disease.  He feels well, has no concerns at this time.  Past Medical History:  Diagnosis Date  . GERD (gastroesophageal reflux disease)   . Seizures (HCC)    states when he was younger    Past Surgical History:  Procedure Laterality Date  . APPENDECTOMY    . CHOLECYSTECTOMY N/A 11/09/2018   Procedure: LAPAROSCOPIC CHOLECYSTECTOMY;  Surgeon: Sung Amabile, DO;  Location: [facility] ORS;  Service: General;  Laterality: N/A;    Current Medications: Current Meds  Medication Sig  . famotidine (PEPCID) 40 MG tablet Take 1 tablet (40 mg total) by mouth every evening.  Marland Kitchen ibuprofen (ADVIL) 600 MG tablet Take 1 tablet (600 mg total) by mouth every 8 (eight) hours as needed for moderate pain.  . meloxicam (MOBIC) 15 MG tablet Take 1 tablet (15 mg total) by mouth daily.     Allergies:   Patient has no known  allergies.   Social History   Socioeconomic History  . Marital status: Single    Spouse name: Not on file  . Number of children: Not on file  . Years of education: Not on file  . Highest education level: Not on file  Occupational History  . Not on file  Tobacco Use  . Smoking status: Never Smoker  . Smokeless tobacco: Never Used  Vaping Use  . Vaping Use: Never used  Substance and Sexual Activity  . Alcohol use: Never  . Drug use: Never  . Sexual activity: Not on file  Other Topics Concern  . Not on file  Social History Narrative  . Not on file   Social Determinants of Health   Financial Resource Strain: Not on file  Food Insecurity: Not on file  Transportation Needs: Not on file  Physical Activity: Not on file  Stress: Not on file  Social Connections: Not on file     Family History: The patient's family history is not on file.  ROS:   Please see the history of present illness.     All other systems reviewed and are negative.  EKGs/Labs/Other Studies Reviewed:    The following studies were reviewed today:   EKG:  EKG is  ordered today.  The ekg ordered today demonstrates sinus rhythm, normal  ECG  Recent Labs: 07/07/2020: ALT 160; BUN 15; Creatinine, Ser 0.85; Hemoglobin 15.6; Platelets 260; Potassium 3.5; Sodium 138; TSH 2.348  Recent Lipid Panel No results found for: CHOL, TRIG, HDL, CHOLHDL, VLDL, LDLCALC, LDLDIRECT   Risk Assessment/Calculations:      Physical Exam:    VS:  BP 110/72 (BP Location: Left Arm, Patient Position: Sitting, Cuff Size: Normal)   Pulse 85   Ht 6' (1.829 m)   Wt 248 lb (112.5 kg)   SpO2 98%   BMI 33.63 kg/m     Wt Readings from Last 3 Encounters:  07/14/20 248 lb (112.5 kg)  07/07/20 249 lb 1.9 oz (113 kg)  07/07/20 250 lb (113.4 kg)     GEN:  Well nourished, well developed in no acute distress HEENT: Normal NECK: No JVD; No carotid bruits LYMPHATICS: No lymphadenopathy CARDIAC: RRR, no murmurs, rubs,  gallops RESPIRATORY:  Clear to auscultation without rales, wheezing or rhonchi  ABDOMEN: Soft, non-tender, non-distended MUSCULOSKELETAL:  No edema; No deformity  SKIN: Warm and dry NEUROLOGIC:  Alert and oriented x 3 PSYCHIATRIC:  Normal affect   ASSESSMENT:    1. Chest pain of uncertain etiology   2. Gastroesophageal reflux disease with esophagitis without hemorrhage    PLAN:    In order of problems listed above:  1. Chest pain, occurring once, does not appear cardiac in etiology.  Likely GI in etiology.  Patient has no risk factors of heart disease, he is low cardiac risk.  Symptoms are currently resolved, continue Pepcid as prescribed. 2. History of reflux, resolved with Pepcid.  Continue as prescribed.  Follow-up with PCP.  May consider OTC Prilosec if symptoms continue.  Follow-up as needed.     Medication Adjustments/Labs and Tests Ordered: Current medicines are reviewed at length with the patient today.  Concerns regarding medicines are outlined above.  Orders Placed This Encounter  Procedures  . EKG 12-Lead   No orders of the defined types were placed in this encounter.   Patient Instructions  Medication Instructions:  Your physician recommends that you continue on your current medications as directed. Please refer to the Current Medication list given to you today.  *If you need a refill on your cardiac medications before your next appointment, please call your pharmacy*   Lab Work: None ordered If you have labs (blood work) drawn today and your tests are completely normal, you will receive your results only by: Marland Kitchen MyChart Message (if you have MyChart) OR . A paper copy in the mail If you have any lab test that is abnormal or we need to change your treatment, we will call you to review the results.   Testing/Procedures: None ordered   Follow-Up: At [facility], you and your health needs are our priority.  As part of our continuing mission to provide you  with exceptional heart care, we have created designated Provider Care Teams.  These Care Teams include your primary Cardiologist (physician) and Advanced Practice Providers (APPs -  Physician Assistants and Nurse Practitioners) who all work together to provide you with the care you need, when you need it.  We recommend signing up for the patient portal called "MyChart".  Sign up information is provided on this After Visit Summary.  MyChart is used to connect with patients for Virtual Visits (Telemedicine).  Patients are able to view lab/test results, encounter notes, upcoming appointments, etc.  Non-urgent messages can be sent to your provider as well.   To learn more about what  you can do with MyChart, go to [email or web address].    Your next appointment:   Follow up as needed   The format for your next appointment:   In Person  Provider:   Debbe Odea, MD   Other Instructions        Signed, Debbe Odea, MD  07/14/2020 12:31 PM    [facility]

## 2020-10-15 ENCOUNTER — Emergency Department
Admission: EM | Admit: 2020-10-15 | Discharge: 2020-10-15 | Disposition: A | Discharge status: Home / Self Care | Payer: BC Managed Care – PPO | Attending: Emergency Medicine | Admitting: Emergency Medicine

## 2020-10-15 ENCOUNTER — Other Ambulatory Visit: Payer: Self-pay

## 2020-10-15 DIAGNOSIS — R1010 Upper abdominal pain, unspecified: Secondary | ICD-10-CM | POA: Diagnosis present

## 2020-10-15 DIAGNOSIS — K439 Ventral hernia without obstruction or gangrene: Secondary | ICD-10-CM

## 2020-10-15 DIAGNOSIS — K219 Gastro-esophageal reflux disease without esophagitis: Secondary | ICD-10-CM | POA: Diagnosis not present

## 2020-10-15 DIAGNOSIS — K469 Unspecified abdominal hernia without obstruction or gangrene: Secondary | ICD-10-CM | POA: Insufficient documentation

## 2020-10-15 LAB — COMPREHENSIVE METABOLIC PANEL
ALT: 122 U/L — ABNORMAL HIGH (ref 0–44)
AST: 62 U/L — ABNORMAL HIGH (ref 15–41)
Albumin: 4.6 g/dL (ref 3.5–5.0)
Alkaline Phosphatase: 84 U/L (ref 38–126)
Anion gap: 10 (ref 5–15)
BUN: 17 mg/dL (ref 6–20)
CO2: 27 mmol/L (ref 22–32)
Calcium: 9.7 mg/dL (ref 8.9–10.3)
Chloride: 105 mmol/L (ref 98–111)
Creatinine, Ser: 0.94 mg/dL (ref 0.61–1.24)
GFR, Estimated: >60 mL/min (ref >60)
Glucose, Bld: 87 mg/dL (ref 70–99)
Potassium: 3.9 mmol/L (ref 3.5–5.1)
Sodium: 142 mmol/L (ref 135–145)
Total Bilirubin: 0.7 mg/dL (ref 0.3–1.2)
Total Protein: 7.9 g/dL (ref 6.5–8.1)

## 2020-10-15 LAB — CBC WITH DIFFERENTIAL/PLATELET
Abs Immature Granulocytes: 0.05 10*3/uL (ref 0.00–0.07)
Basophils Absolute: 0 10*3/uL (ref 0.0–0.1)
Basophils Relative: 0 %
Eosinophils Absolute: 0.1 10*3/uL (ref 0.0–0.5)
Eosinophils Relative: 1 %
HCT: 44.7 % (ref 39.0–52.0)
Hemoglobin: 15.6 g/dL (ref 13.0–17.0)
Immature Granulocytes: 1 %
Lymphocytes Relative: 19 %
Lymphs Abs: 1.8 10*3/uL (ref 0.7–4.0)
MCH: 30.6 pg (ref 26.0–34.0)
MCHC: 34.9 g/dL (ref 30.0–36.0)
MCV: 87.8 fL (ref 80.0–100.0)
Monocytes Absolute: 0.6 10*3/uL (ref 0.1–1.0)
Monocytes Relative: 6 %
Neutro Abs: 6.9 10*3/uL (ref 1.7–7.7)
Neutrophils Relative %: 73 %
Platelets: 266 10*3/uL (ref 150–400)
RBC: 5.09 MIL/uL (ref 4.22–5.81)
RDW: 13.1 % (ref 11.5–15.5)
WBC: 9.5 10*3/uL (ref 4.0–10.5)
nRBC: 0 % (ref 0.0–0.2)

## 2020-10-15 LAB — LIPASE, BLOOD: Lipase: 27 U/L (ref 11–51)

## 2020-10-15 NOTE — ED Provider Notes (Signed)
[facility] Emergency Department Provider Note   ____________________________________________    I have reviewed the triage vital signs and the nursing notes.   HISTORY  Chief Complaint Abdominal Pain     HPI Kristopher Allen is a 35 y.o. male who presents with complaints of possible hernia.  Patient reports for some time he has had a bulging in his upper abdomen, centrally which occurs when he is lifting something heavy or when lying down and lifting his upper body.  He denies nausea or vomiting.  No pain at this time.  He reports is been present for many months.  No fevers chills, normal stools  Past Medical History:  Diagnosis Date   GERD (gastroesophageal reflux disease)    Seizures (HCC)    states when he was younger    Patient Active Problem List   Diagnosis Date Noted   Acute cholecystitis 11/07/2018    Past Surgical History:  Procedure Laterality Date   APPENDECTOMY     CHOLECYSTECTOMY N/A 11/09/2018   Procedure: LAPAROSCOPIC CHOLECYSTECTOMY;  Surgeon: Sung Amabile, DO;  Location: [facility] ORS;  Service: General;  Laterality: N/A;    Prior to Admission medications   Medication Sig Start Date End Date Taking? Authorizing Provider  famotidine (PEPCID) 40 MG tablet Take 1 tablet (40 mg total) by mouth every evening. 07/07/20 07/07/21  Orvil Feil, PA-C  ibuprofen (ADVIL) 600 MG tablet Take 1 tablet (600 mg total) by mouth every 8 (eight) hours as needed for moderate pain. 08/23/19   Shaune Pollack, MD  meloxicam (MOBIC) 15 MG tablet Take 1 tablet (15 mg total) by mouth daily. 06/14/20 06/14/21  Orvil Feil, PA-C     Allergies Patient has no known allergies.  No family history on file.  Social History Social History   Tobacco Use   Smoking status: Never   Smokeless tobacco: Never  Vaping Use   Vaping Use: Never used  Substance Use Topics   Alcohol use: Never   Drug use: Never    Review of Systems  Constitutional: No  fever/chills Eyes: No visual changes.  ENT: No sore throat. Cardiovascular: Denies chest pain. Respiratory: Denies shortness of breath. Gastrointestinal: As above Genitourinary: Negative for dysuria. Musculoskeletal: Negative for back pain. Skin: Negative for rash. Neurological: Negative for headaches or weakness   ____________________________________________   PHYSICAL EXAM:  VITAL SIGNS: ED Triage Vitals  Enc Vitals Group     BP 10/15/20 1718 (!) 146/78     Pulse Rate 10/15/20 1718 86     Resp 10/15/20 1718 16     Temp 10/15/20 1718 98.6 F (37 C)     Temp Source 10/15/20 1718 Oral     SpO2 10/15/20 1718 99 %     Weight 10/15/20 1719 113.4 kg (250 lb)     Height 10/15/20 1719 1.829 m (6')     Head Circumference --      Peak Flow --      Pain Score 10/15/20 1719 6     Pain Loc --      Pain Edu? --      Excl. in GC? --     Constitutional: Alert and oriented. No acute distress. Pleasant and interactive  Mouth/Throat: Mucous membranes are moist.   Neck:  Painless ROM Cardiovascular: Normal rate, regular rhythm. Grossly normal heart sounds.  Good peripheral circulation. Respiratory: Normal respiratory effort.  No retractions. Lungs CTAB. Gastrointestinal: Soft, nontender, palpable hernia just above the umbilicus consistent with  abdominal wall hernia, easily reducible  Musculoskeletal: No lower extremity tenderness nor edema.  Warm and well perfused Neurologic:  Normal speech and language. No gross focal neurologic deficits are appreciated.  Skin:  Skin is warm, dry and intact. No rash noted. Psychiatric: Mood and affect are normal. Speech and behavior are normal.  ____________________________________________   LABS (all labs ordered are listed, but only abnormal results are displayed)  Labs Reviewed  COMPREHENSIVE METABOLIC PANEL - Abnormal; Notable for the following components:      Result Value   AST 62 (*)    ALT 122 (*)    All other components within  normal limits  LIPASE, BLOOD  CBC WITH DIFFERENTIAL/PLATELET  URINALYSIS, ROUTINE W REFLEX MICROSCOPIC   ____________________________________________  EKG  None ____________________________________________  RADIOLOGY  None ____________________________________________   PROCEDURES  Procedure(s) performed: No  Procedures   Critical Care performed: No ____________________________________________   INITIAL IMPRESSION / ASSESSMENT AND PLAN / ED COURSE  Pertinent labs & imaging results that were available during my care of the patient were reviewed by me and considered in my medical decision making (see chart for details).   Patient well-appearing in no acute distress, palpable hernia on exam, easily reducible, nontender, no redness.  Lab work today is reassuring, normal white blood cell count, mildly elevated AST ALT which is apparently chronic.  Will refer the patient to general surgery for further evaluation    ____________________________________________   FINAL CLINICAL IMPRESSION(S) / ED DIAGNOSES  Final diagnoses:  Abdominal wall hernia        Note:  This document was prepared using Dragon voice recognition software and may include unintentional dictation errors.    Jene Every, MD 10/15/20 [phone]

## 2020-10-15 NOTE — ED Notes (Signed)
Patient provided urine cup, and verbalized understanding of need for urine sample when able to provide one.

## 2020-10-15 NOTE — ED Triage Notes (Signed)
Patient reports abdominal pain to the umbilical area intermittently for "2 years now." Patient reports pain worsens with bearing down, lifting, and coughing. Patient reports last BM today. Patient denies nausea, vomiting, or diarrhea.

## 2020-10-21 ENCOUNTER — Ambulatory Visit: Payer: Self-pay | Admitting: Surgery

## 2020-10-21 NOTE — H&P (View-Only) (Signed)
Subjective:    CC: Incisional hernia, without obstruction or gangrene [K43.2]   HPI:  Kristopher Allen is a 35 y.o. male who returns  for evaluation of above. Symptoms were first noted several weeks ago. Pain is intermittent and discomfort, confined to the epigastric area, without radiation.  Associated with nothing specific, exacerbated by nothing specific,  Lump is reducible.    Past Medical History:  has a past medical history of Seizure, petit mal (CMS-HCC).   Past Surgical History:       Past Surgical History:  Procedure Laterality Date   APPENDECTOMY       CHOLECYSTECTOMY       LAPAROSCOPIC CHOLECYSTECTOMY N/A 11/09/2018    Dr. Allycia Pitz      Family History: family history is not on file.   Social History:  reports that he has never smoked. He has never used smokeless tobacco. He reports that he does not drink alcohol and does not use drugs.   Current Medications: has a current medication list which includes the following prescription(s): fluticasone propionate and ibuprofen.   Allergies:     Allergies as of 10/21/2020   (No Known Allergies)      ROS:  A 15 point review of systems was performed and pertinent positives and negatives noted in HPI   Objective:    BP 131/87   Pulse 84   Ht 182.9 cm (6')   Wt (!) 113.4 kg (250 lb)   BMI 33.91 kg/m    Constitutional :  alert, appears stated age, cooperative and no distress  Lymphatics/Throat:  no asymmetry, masses, or scars  Respiratory:  clear to auscultation bilaterally  Cardiovascular:  regular rate and rhythm  Gastrointestinal: soft, non-tender; bowel sounds normal; no masses,  no organomegaly. incisional hernia noted.  small, reducible, no overlying skin changes and epigastric port  Musculoskeletal: Steady gait and movement  Skin: Cool and moist, visible surgical scars   Psychiatric: Normal affect, non-agitated, not confused         LABS:  n/a    RADS: n/a Assessment:        Incisional hernia,  without obstruction or gangrene [K43.2]   Plan:    1. Incisional hernia, without obstruction or gangrene [K43.2]   Discussed the risk of surgery including recurrence, which can be up to 50% in the case of incisional or complex hernias, possible use of prosthetic materials (mesh) and the increased risk of mesh infxn if used, bleeding, chronic pain, post-op infxn, post-op SBO or ileus, and possible re-operation to address said risks. The risks of general anesthetic, if used, includes MI, CVA, sudden death or even reaction to anesthetic medications also discussed. Alternatives include continued observation.  Benefits include possible symptom relief, prevention of incarceration, strangulation, enlargement in size over time, and the risk of emergency surgery in the face of strangulation.    Typical post-op recovery time of 3-5 days with 2 weeks of activity restrictions were also discussed.   ED return precautions given for sudden increase in pain, size of hernia with accompanying fever, nausea, and/or vomiting.   The patient verbalized understanding and all questions were answered to the patient's satisfaction.     2. Patient has elected to proceed with surgical treatment. Procedure will be scheduled.  Written consent was obtained., OPEN, due to small size of hernia  

## 2020-10-21 NOTE — H&P (Signed)
Subjective:    CC: Incisional hernia, without obstruction or gangrene [K43.2]   HPI:  Kristopher Allen is a 35 y.o. male who returns  for evaluation of above. Symptoms were first noted several weeks ago. Pain is intermittent and discomfort, confined to the epigastric area, without radiation.  Associated with nothing specific, exacerbated by nothing specific,  Lump is reducible.    Past Medical History:  has a past medical history of Seizure, petit mal (CMS-HCC).   Past Surgical History:       Past Surgical History:  Procedure Laterality Date   APPENDECTOMY       CHOLECYSTECTOMY       LAPAROSCOPIC CHOLECYSTECTOMY N/A 11/09/2018    Dr. Sung Amabile      Family History: family history is not on file.   Social History:  reports that he has never smoked. He has never used smokeless tobacco. He reports that he does not drink alcohol and does not use drugs.   Current Medications: has a current medication list which includes the following prescription(s): fluticasone propionate and ibuprofen.   Allergies:     Allergies as of 10/21/2020   (No Known Allergies)      ROS:  A 15 point review of systems was performed and pertinent positives and negatives noted in HPI   Objective:    BP 131/87   Pulse 84   Ht 182.9 cm (6')   Wt (!) 113.4 kg (250 lb)   BMI 33.91 kg/m    Constitutional :  alert, appears stated age, cooperative and no distress  Lymphatics/Throat:  no asymmetry, masses, or scars  Respiratory:  clear to auscultation bilaterally  Cardiovascular:  regular rate and rhythm  Gastrointestinal: soft, non-tender; bowel sounds normal; no masses,  no organomegaly. incisional hernia noted.  small, reducible, no overlying skin changes and epigastric port  Musculoskeletal: Steady gait and movement  Skin: Cool and moist, visible surgical scars   Psychiatric: Normal affect, non-agitated, not confused         LABS:  n/a    RADS: n/a Assessment:        Incisional hernia,  without obstruction or gangrene [K43.2]   Plan:    1. Incisional hernia, without obstruction or gangrene [K43.2]   Discussed the risk of surgery including recurrence, which can be up to 50% in the case of incisional or complex hernias, possible use of prosthetic materials (mesh) and the increased risk of mesh infxn if used, bleeding, chronic pain, post-op infxn, post-op SBO or ileus, and possible re-operation to address said risks. The risks of general anesthetic, if used, includes MI, CVA, sudden death or even reaction to anesthetic medications also discussed. Alternatives include continued observation.  Benefits include possible symptom relief, prevention of incarceration, strangulation, enlargement in size over time, and the risk of emergency surgery in the face of strangulation.    Typical post-op recovery time of 3-5 days with 2 weeks of activity restrictions were also discussed.   ED return precautions given for sudden increase in pain, size of hernia with accompanying fever, nausea, and/or vomiting.   The patient verbalized understanding and all questions were answered to the patient's satisfaction.     2. Patient has elected to proceed with surgical treatment. Procedure will be scheduled.  Written consent was obtained., OPEN, due to small size of hernia

## 2020-10-24 ENCOUNTER — Other Ambulatory Visit: Payer: Self-pay

## 2020-10-24 ENCOUNTER — Encounter
Admission: RE | Admit: 2020-10-24 | Discharge: 2020-10-24 | Disposition: A | Discharge status: Home / Self Care | Payer: BC Managed Care – PPO | Source: Ambulatory Visit | Attending: Surgery | Admitting: Surgery

## 2020-10-24 HISTORY — DX: Ventral hernia without obstruction or gangrene: K43.9

## 2020-10-24 NOTE — Patient Instructions (Signed)
Your procedure is scheduled on:10-30-20 Thursday Report to the Registration Desk on the 1st floor of the Medical Mall-Then proceed to the 2nd floor Surgery Desk in the Medical Mall To find out your arrival time, please call [phone] between 1PM - 3PM on:10-29-20 Wednesday  REMEMBER: Instructions that are not followed completely may result in serious medical risk, up to and including death; or upon the discretion of your surgeon and anesthesiologist your surgery may need to be rescheduled.  Do not eat food after midnight the night before surgery.  No gum chewing, lozengers or hard candies.  You may however, drink CLEAR liquids up to 2 hours before you are scheduled to arrive for your surgery. Do not drink anything within 2 hours of your scheduled arrival time.  Clear liquids include: - water  - apple juice without pulp - gatorade (not RED, PURPLE, OR BLUE) - black coffee or tea (Do NOT add milk or creamers to the coffee or tea) Do NOT drink anything that is not on this list.  Do not take any medication the day of surgery  One week prior to surgery: Stop Anti-inflammatories (NSAIDS) such as Advil, Aleve, Ibuprofen, Motrin, Naproxen, Naprosyn and Aspirin based products such as Excedrin, Goodys Powder, BC Powder. You may however, take Tylenol if needed for pain up until the day of surgery.  Stop ANY OVER THE COUNTER supplements/vitamins NOW (10-24-20) until after surgery.  No Alcohol for 24 hours before or after surgery.  No Smoking including e-cigarettes for 24 hours prior to surgery.  No chewable tobacco products for at least 6 hours prior to surgery.  No nicotine patches on the day of surgery.  Do not use any "recreational" drugs for at least a week prior to your surgery.  Please be advised that the combination of cocaine and anesthesia may have negative outcomes, up to and including death. If you test positive for cocaine, your surgery will be cancelled.  On the morning of  surgery brush your teeth with toothpaste and water, you may rinse your mouth with mouthwash if you wish. Do not swallow any toothpaste or mouthwash.  Do not wear jewelry, make-up, hairpins, clips or nail polish.  Do not wear lotions, powders, or perfumes.   Do not shave body from the neck down 48 hours prior to surgery just in case you cut yourself which could leave a site for infection.  Also, freshly shaved skin may become irritated if using the CHG soap.  Contact lenses, hearing aids and dentures may not be worn into surgery.  Do not bring valuables to the hospital. [facility] is not responsible for any missing/lost belongings or valuables.   Use CHG Soap as directed on instruction sheet.  Notify your doctor if there is any change in your medical condition (cold, fever, infection).  Wear comfortable clothing (specific to your surgery type) to the hospital.  After surgery, you can help prevent lung complications by doing breathing exercises.  Take deep breaths and cough every 1-2 hours. Your doctor may order a device called an Incentive Spirometer to help you take deep breaths. When coughing or sneezing, hold a pillow firmly against your incision with both hands. This is called "splinting." Doing this helps protect your incision. It also decreases belly discomfort.  If you are being admitted to the hospital overnight, leave your suitcase in the car. After surgery it may be brought to your room.  If you are being discharged the day of surgery, you will not be allowed  to drive home. You will need a responsible adult (18 years or older) to drive you home and stay with you that night.   If you are taking public transportation, you will need to have a responsible adult (18 years or older) with you. Please confirm with your physician that it is acceptable to use public transportation.   Please call the [place] [facility] at [phone] if you have any questions about these  instructions.  Surgery Visitation Policy:  Patients undergoing a surgery or procedure may have one family member or support person with them as long as that person is not COVID-19 positive or experiencing its symptoms.  That person may remain in the waiting area during the procedure.  Inpatient Visitation:    Visiting hours are 7 a.m. to 8 p.m. Inpatients will be allowed two visitors daily. The visitors may change each day during the patient's stay. No visitors under the age of 71. Any visitor under the age of 5 must be accompanied by an adult. The visitor must pass COVID-19 screenings, use hand sanitizer when entering and exiting the patient's room and wear a mask at all times, including in the patient's room. Patients must also wear a mask when staff or their visitor are in the room. Masking is required regardless of vaccination status.

## 2020-10-30 ENCOUNTER — Other Ambulatory Visit: Payer: Self-pay

## 2020-10-30 ENCOUNTER — Encounter
Admission: RE | Disposition: A | Discharge status: Home / Self Care | Payer: Self-pay | Source: Home / Self Care | Attending: Surgery

## 2020-10-30 ENCOUNTER — Ambulatory Visit: Payer: BC Managed Care – PPO | Admitting: Anesthesiology

## 2020-10-30 ENCOUNTER — Encounter: Payer: Self-pay | Admitting: Surgery

## 2020-10-30 ENCOUNTER — Ambulatory Visit
Admission: RE | Admit: 2020-10-30 | Discharge: 2020-10-30 | Disposition: A | Discharge status: Home / Self Care | Payer: BC Managed Care – PPO | Attending: Surgery | Admitting: Surgery

## 2020-10-30 DIAGNOSIS — Z9049 Acquired absence of other specified parts of digestive tract: Secondary | ICD-10-CM | POA: Insufficient documentation

## 2020-10-30 DIAGNOSIS — K432 Incisional hernia without obstruction or gangrene: Secondary | ICD-10-CM | POA: Diagnosis not present

## 2020-10-30 DIAGNOSIS — G40A09 Absence epileptic syndrome, not intractable, without status epilepticus: Secondary | ICD-10-CM | POA: Diagnosis not present

## 2020-10-30 DIAGNOSIS — K431 Incisional hernia with gangrene: Secondary | ICD-10-CM

## 2020-10-30 HISTORY — PX: INSERTION OF MESH: SHX5868

## 2020-10-30 HISTORY — PX: INCISIONAL HERNIA REPAIR: SHX193

## 2020-10-30 SURGERY — REPAIR, HERNIA, INCISIONAL
Anesthesia: General | Site: Abdomen

## 2020-10-30 MED ORDER — LIDOCAINE HCL (PF) 2 % IJ SOLN
INTRAMUSCULAR | Status: AC
  Filled 2020-10-30: qty 5

## 2020-10-30 MED ORDER — BUPIVACAINE HCL 0.5 % IJ SOLN
INTRAMUSCULAR | Status: DC | PRN
  Administered 2020-10-30: 5 mL

## 2020-10-30 MED ORDER — MEPERIDINE HCL 25 MG/ML IJ SOLN: 6.2500 mg | INTRAMUSCULAR | Status: DC | PRN

## 2020-10-30 MED ORDER — OXYCODONE HCL 5 MG PO TABS
ORAL_TABLET | ORAL | Status: AC
  Filled 2020-10-30: qty 1

## 2020-10-30 MED ORDER — CEFAZOLIN SODIUM-DEXTROSE 2-4 GM/100ML-% IV SOLN
2.0000 g | INTRAVENOUS | Status: AC
Start: 2020-10-30 — End: 2020-10-30
  Administered 2020-10-30: 2 g via INTRAVENOUS

## 2020-10-30 MED ORDER — ORAL CARE MOUTH RINSE: 15.0000 mL | Freq: Once | OROMUCOSAL | Status: AC

## 2020-10-30 MED ORDER — ROCURONIUM BROMIDE 100 MG/10ML IV SOLN
INTRAVENOUS | Status: DC | PRN
  Administered 2020-10-30: 60 mg via INTRAVENOUS
  Administered 2020-10-30: 20 mg via INTRAVENOUS

## 2020-10-30 MED ORDER — DEXMEDETOMIDINE (PRECEDEX) IN NS 20 MCG/5ML (4 MCG/ML) IV SYRINGE
PREFILLED_SYRINGE | INTRAVENOUS | Status: DC | PRN
  Administered 2020-10-30: 12 ug via INTRAVENOUS
  Administered 2020-10-30: 8 ug via INTRAVENOUS

## 2020-10-30 MED ORDER — DEXAMETHASONE SODIUM PHOSPHATE 10 MG/ML IJ SOLN
INTRAMUSCULAR | Status: DC | PRN
  Administered 2020-10-30: 10 mg via INTRAVENOUS

## 2020-10-30 MED ORDER — CHLORHEXIDINE GLUCONATE CLOTH 2 % EX PADS
6.0000 | MEDICATED_PAD | Freq: Once | CUTANEOUS | Status: AC
  Administered 2020-10-30: 6 via TOPICAL

## 2020-10-30 MED ORDER — HYDROCODONE-ACETAMINOPHEN 5-325 MG PO TABS: 1.0000 | ORAL_TABLET | Freq: Four times a day (QID) | ORAL | 0 refills | Status: AC | PRN

## 2020-10-30 MED ORDER — SUGAMMADEX SODIUM 200 MG/2ML IV SOLN
INTRAVENOUS | Status: DC | PRN
  Administered 2020-10-30 (×2): 50 mg via INTRAVENOUS
  Administered 2020-10-30: 200 mg via INTRAVENOUS
  Administered 2020-10-30 (×2): 50 mg via INTRAVENOUS

## 2020-10-30 MED ORDER — LACTATED RINGERS IV SOLN: INTRAVENOUS | Status: DC

## 2020-10-30 MED ORDER — CHLORHEXIDINE GLUCONATE 0.12 % MT SOLN
OROMUCOSAL | Status: AC
  Administered 2020-10-30: 15 mL via OROMUCOSAL
  Filled 2020-10-30: qty 15

## 2020-10-30 MED ORDER — ACETAMINOPHEN 500 MG PO TABS: 1000.0000 mg | ORAL_TABLET | ORAL | Status: AC

## 2020-10-30 MED ORDER — ONDANSETRON HCL 4 MG/2ML IJ SOLN
INTRAMUSCULAR | Status: AC
  Filled 2020-10-30: qty 2

## 2020-10-30 MED ORDER — CEFAZOLIN SODIUM-DEXTROSE 2-4 GM/100ML-% IV SOLN
INTRAVENOUS | Status: AC
  Filled 2020-10-30: qty 100

## 2020-10-30 MED ORDER — PROPOFOL 10 MG/ML IV BOLUS
INTRAVENOUS | Status: AC
  Filled 2020-10-30: qty 20

## 2020-10-30 MED ORDER — FAMOTIDINE 20 MG PO TABS
ORAL_TABLET | ORAL | Status: AC
  Administered 2020-10-30: 20 mg via ORAL
  Filled 2020-10-30: qty 1

## 2020-10-30 MED ORDER — BUPIVACAINE-MELOXICAM ER 200-6 MG/7ML IJ SOLN
INTRAMUSCULAR | Status: DC | PRN
  Administered 2020-10-30: 7 mL

## 2020-10-30 MED ORDER — HYDROMORPHONE HCL 1 MG/ML IJ SOLN
0.2500 mg | INTRAMUSCULAR | Status: DC | PRN
  Administered 2020-10-30 (×3): 0.5 mg via INTRAVENOUS

## 2020-10-30 MED ORDER — ONDANSETRON HCL 4 MG/2ML IJ SOLN
INTRAMUSCULAR | Status: DC | PRN
  Administered 2020-10-30: 4 mg via INTRAVENOUS

## 2020-10-30 MED ORDER — LACTATED RINGERS IV SOLN: INTRAVENOUS | Status: DC | PRN

## 2020-10-30 MED ORDER — MIDAZOLAM HCL 2 MG/2ML IJ SOLN
INTRAMUSCULAR | Status: AC
  Filled 2020-10-30: qty 2

## 2020-10-30 MED ORDER — GABAPENTIN 300 MG PO CAPS: 300.0000 mg | ORAL_CAPSULE | ORAL | Status: AC

## 2020-10-30 MED ORDER — BUPIVACAINE LIPOSOME 1.3 % IJ SUSP
INTRAMUSCULAR | Status: AC
  Filled 2020-10-30: qty 20

## 2020-10-30 MED ORDER — FENTANYL CITRATE (PF) 100 MCG/2ML IJ SOLN
INTRAMUSCULAR | Status: AC
  Filled 2020-10-30: qty 2

## 2020-10-30 MED ORDER — GLYCOPYRROLATE 0.2 MG/ML IJ SOLN
INTRAMUSCULAR | Status: DC | PRN
  Administered 2020-10-30 (×2): .1 mg via INTRAVENOUS

## 2020-10-30 MED ORDER — PROPOFOL 10 MG/ML IV BOLUS
INTRAVENOUS | Status: DC | PRN
  Administered 2020-10-30: 200 mg via INTRAVENOUS

## 2020-10-30 MED ORDER — DEXAMETHASONE SODIUM PHOSPHATE 10 MG/ML IJ SOLN
INTRAMUSCULAR | Status: AC
  Filled 2020-10-30: qty 1

## 2020-10-30 MED ORDER — ACETAMINOPHEN 500 MG PO TABS
ORAL_TABLET | ORAL | Status: AC
  Administered 2020-10-30: 1000 mg via ORAL
  Filled 2020-10-30: qty 2

## 2020-10-30 MED ORDER — OXYCODONE HCL 5 MG PO TABS
5.0000 mg | ORAL_TABLET | Freq: Once | ORAL | Status: AC | PRN
  Administered 2020-10-30: 5 mg via ORAL

## 2020-10-30 MED ORDER — BUPIVACAINE-MELOXICAM ER 200-6 MG/7ML IJ SOLN
INTRAMUSCULAR | Status: AC
  Filled 2020-10-30: qty 1

## 2020-10-30 MED ORDER — FENTANYL CITRATE (PF) 100 MCG/2ML IJ SOLN
INTRAMUSCULAR | Status: DC | PRN
  Administered 2020-10-30: 100 ug via INTRAVENOUS

## 2020-10-30 MED ORDER — ROCURONIUM BROMIDE 10 MG/ML (PF) SYRINGE
PREFILLED_SYRINGE | INTRAVENOUS | Status: AC
  Filled 2020-10-30: qty 10

## 2020-10-30 MED ORDER — HYDROMORPHONE HCL 1 MG/ML IJ SOLN
INTRAMUSCULAR | Status: AC
  Filled 2020-10-30: qty 1

## 2020-10-30 MED ORDER — SUGAMMADEX SODIUM 500 MG/5ML IV SOLN
INTRAVENOUS | Status: AC
  Filled 2020-10-30: qty 5

## 2020-10-30 MED ORDER — PROMETHAZINE HCL 25 MG/ML IJ SOLN: 12.5000 mg | Freq: Once | INTRAMUSCULAR | Status: DC | PRN

## 2020-10-30 MED ORDER — BUPIVACAINE-EPINEPHRINE (PF) 0.5% -1:200000 IJ SOLN
INTRAMUSCULAR | Status: AC
  Filled 2020-10-30: qty 30

## 2020-10-30 MED ORDER — ACETAMINOPHEN 325 MG PO TABS: 325.0000 mg | ORAL_TABLET | ORAL | Status: DC | PRN

## 2020-10-30 MED ORDER — FAMOTIDINE 20 MG PO TABS: 20.0000 mg | ORAL_TABLET | Freq: Once | ORAL | Status: AC

## 2020-10-30 MED ORDER — IBUPROFEN 800 MG PO TABS: 800.0000 mg | ORAL_TABLET | Freq: Three times a day (TID) | ORAL | 0 refills | Status: AC | PRN

## 2020-10-30 MED ORDER — GLYCOPYRROLATE 0.2 MG/ML IJ SOLN
INTRAMUSCULAR | Status: AC
  Filled 2020-10-30: qty 1

## 2020-10-30 MED ORDER — LIDOCAINE HCL (CARDIAC) PF 100 MG/5ML IV SOSY
PREFILLED_SYRINGE | INTRAVENOUS | Status: DC | PRN
  Administered 2020-10-30: 100 mg via INTRAVENOUS

## 2020-10-30 MED ORDER — CHLORHEXIDINE GLUCONATE 0.12 % MT SOLN: 15.0000 mL | Freq: Once | OROMUCOSAL | Status: AC

## 2020-10-30 MED ORDER — GABAPENTIN 300 MG PO CAPS
ORAL_CAPSULE | ORAL | Status: AC
  Administered 2020-10-30: 300 mg via ORAL
  Filled 2020-10-30: qty 1

## 2020-10-30 MED ORDER — HYDROMORPHONE HCL 1 MG/ML IJ SOLN
INTRAMUSCULAR | Status: AC
  Administered 2020-10-30: 0.5 mg via INTRAVENOUS
  Filled 2020-10-30: qty 1

## 2020-10-30 MED ORDER — ACETAMINOPHEN 10 MG/ML IV SOLN: 1000.0000 mg | Freq: Once | INTRAVENOUS | Status: DC | PRN

## 2020-10-30 MED ORDER — ACETAMINOPHEN 325 MG PO TABS: 650.0000 mg | ORAL_TABLET | Freq: Three times a day (TID) | ORAL | 0 refills | Status: AC | PRN

## 2020-10-30 MED ORDER — DOCUSATE SODIUM 100 MG PO CAPS: 100.0000 mg | ORAL_CAPSULE | Freq: Two times a day (BID) | ORAL | 0 refills | Status: AC | PRN

## 2020-10-30 MED ORDER — OXYCODONE HCL 5 MG/5ML PO SOLN
5.0000 mg | Freq: Once | ORAL | Status: AC | PRN
Start: 2020-10-30 — End: 2020-10-30

## 2020-10-30 MED ORDER — ACETAMINOPHEN 160 MG/5ML PO SOLN
325.0000 mg | ORAL | Status: DC | PRN
  Filled 2020-10-30: qty 20.3

## 2020-10-30 MED ORDER — MIDAZOLAM HCL 2 MG/2ML IJ SOLN
INTRAMUSCULAR | Status: DC | PRN
  Administered 2020-10-30: 2 mg via INTRAVENOUS

## 2020-10-30 MED ORDER — CEFAZOLIN SODIUM 1 G IJ SOLR
INTRAMUSCULAR | Status: AC
  Filled 2020-10-30: qty 10

## 2020-10-30 SURGICAL SUPPLY — 32 items
ADH SKN CLS APL DERMABOND .7 (GAUZE/BANDAGES/DRESSINGS) ×1
APL PRP STRL LF DISP 70% ISPRP (MISCELLANEOUS) ×1
BLADE SURG 15 STRL LF DISP TIS (BLADE) ×1 IMPLANT
BLADE SURG 15 STRL SS (BLADE) ×2
CHLORAPREP W/TINT 26 (MISCELLANEOUS) ×2 IMPLANT
DERMABOND ADVANCED (GAUZE/BANDAGES/DRESSINGS) ×1
DERMABOND ADVANCED .7 DNX12 (GAUZE/BANDAGES/DRESSINGS) ×1 IMPLANT
DRAPE LAPAROTOMY 100X77 ABD (DRAPES) ×2 IMPLANT
ELECT CAUTERY BLADE 6.4 (BLADE) ×2 IMPLANT
ELECT REM PT RETURN 9FT ADLT (ELECTROSURGICAL) ×2
ELECTRODE REM PT RTRN 9FT ADLT (ELECTROSURGICAL) ×1 IMPLANT
GAUZE 4X4 16PLY ~~LOC~~+RFID DBL (SPONGE) ×2 IMPLANT
GLOVE SURG SYN 6.5 ES PF (GLOVE) ×2 IMPLANT
GLOVE SURG UNDER POLY LF SZ7 (GLOVE) ×2 IMPLANT
GOWN STRL REUS W/ TWL LRG LVL3 (GOWN DISPOSABLE) ×2 IMPLANT
GOWN STRL REUS W/TWL LRG LVL3 (GOWN DISPOSABLE) ×4
KIT TURNOVER KIT A (KITS) ×2 IMPLANT
LABEL OR SOLS (LABEL) ×2 IMPLANT
MANIFOLD NEPTUNE II (INSTRUMENTS) ×2 IMPLANT
MESH VENTRALEX ST 1-7/10 CRC S (Mesh General) ×2 IMPLANT
NEEDLE HYPO 22GX1.5 SAFETY (NEEDLE) ×4 IMPLANT
NS IRRIG 500ML POUR BTL (IV SOLUTION) ×2 IMPLANT
PACK BASIN MINOR ARMC (MISCELLANEOUS) ×2 IMPLANT
SUT ETHIBOND NAB MO 7 #0 18IN (SUTURE) ×2 IMPLANT
SUT MNCRL 4-0 (SUTURE) ×2
SUT MNCRL 4-0 27XMFL (SUTURE) ×1
SUT VIC AB 3-0 SH 27 (SUTURE) ×6
SUT VIC AB 3-0 SH 27X BRD (SUTURE) ×3 IMPLANT
SUTURE MNCRL 4-0 27XMF (SUTURE) ×1 IMPLANT
SYR 10ML LL (SYRINGE) ×4 IMPLANT
SYR 20ML LL LF (SYRINGE) ×2 IMPLANT
WATER STERILE IRR 1000ML POUR (IV SOLUTION) ×2 IMPLANT

## 2020-10-30 NOTE — Op Note (Signed)
Preoperative diagnosis: Incisional hernia obstructed  Postoperative diagnosis: same  Procedure:  Open incisional hernia repair with mesh  Anesthesia: GETA  Surgeon: Sung Amabile  Wound Classification: Clean  Specimen: none  Complications: None  Estimated Blood Loss: minimal  Indications:see HPI  Findings: 1.5 cm x one-point cm incisional hernia 4. Tension free repair achieved with mesh and suture 5. Adequate hemostasis  Description of procedure: The patient was brought to the operating room and general anesthesia was induced. A time-out was completed verifying correct patient, procedure, site, positioning, and implant(s) and/or special equipment prior to beginning this procedure. Antibiotics were administered prior to making the incision. SCDs placed. The anterior abdominal wall was prepped and draped in the standard sterile fashion.   An infraumbilical incision was made after infusing the preplanned incision with half percent Marcaine.  Dissection carried down to fascia  and there was noted to be a 1.5 cm x 1.7 cm incisional hernia.  The preperitoneal fat contents were dissected off the surrounding structures and reduced.  Hemostasis was confirmed prior to reducing the actual contents. Due to attenuated fascia, decision was made to proceed with a mesh repair.    A 4.1cm umbilical mesh was placed within the preperitoneal cavity through the present defect and secured in place on the abdominal wall using interrupted 0 Ethibond sutures.  Once the mesh was noted to be laying flat and secured to the abdominal wall the defect itself was primary closed using 0 Ethibond in a interrupted fashion.  2 subcentimeter tears reinforced with 0 Ethibond x2 in the area.  The fascia as well as the skin incision was then infused with Zynrelef.  After confirming hemostasis, the wound was irrigated and closed in a multilayer fashion, using 3-0 Vicryl for the deep dermal layer in an interrupted fashion and  running 4-0 Monocryl in a subcuticular fashion.  Wound was then dressed with Dermabond.  Patient was then successfully awakened and transferred to PACU in stable condition.  At the end of the procedure sponge and instrument counts were correct

## 2020-10-30 NOTE — Anesthesia Postprocedure Evaluation (Signed)
Anesthesia Post Note  Patient: Kristopher Allen  Procedure(s) Performed: HERNIA REPAIR INCISIONAL (Abdomen) INSERTION OF MESH (Abdomen)  Patient location during evaluation: PACU Anesthesia Type: General Level of consciousness: awake and alert Pain management: pain level controlled Vital Signs Assessment: post-procedure vital signs reviewed and stable Respiratory status: spontaneous breathing, nonlabored ventilation, respiratory function stable and patient connected to nasal cannula oxygen Cardiovascular status: blood pressure returned to baseline and stable Postop Assessment: no apparent nausea or vomiting Anesthetic complications: no   No notable events documented.   Last Vitals:  Vitals:   10/30/20 1455 10/30/20 1500  BP:  118/66  Pulse: 67 74  Resp: 14 14  Temp:    SpO2: 94% 95%    Last Pain:  Vitals:   10/30/20 1500  TempSrc:   PainSc: 5                  Jakhi Dishman M Jeslin Bazinet

## 2020-10-30 NOTE — Discharge Instructions (Addendum)
Hernia repair, Care After This sheet gives you information about how to care for yourself after your procedure. Your health care provider may also give you more specific instructions. If you have problems or questions, contact your health care provider. What can I expect after the procedure? After your procedure, it is common to have the following: Pain in your abdomen, especially in the incision areas. You will be given medicine to control the pain. Tiredness. This is a normal part of the recovery process. Your energy level will return to normal over the next several weeks. Changes in your bowel movements, such as constipation or needing to go more often. Talk with your health care provider about how to manage this. Follow these instructions at home: Medicines  tylenol and advil as needed for discomfort.  Please alternate between the two every four hours as needed for pain.    Use narcotics, if prescribed, only when tylenol and motrin is not enough to control pain.  325-650mg every 8hrs to max of 3000mg/24hrs (including the 325mg in every norco dose) for the tylenol.    Advil up to 800mg per dose every 8hrs as needed for pain.   PLEASE RECORD NUMBER OF PILLS TAKEN UNTIL NEXT FOLLOW UP APPT.  THIS WILL HELP DETERMINE HOW READY YOU ARE TO BE RELEASED FROM ANY ACTIVITY RESTRICTIONS Do not drive or use heavy machinery while taking prescription pain medicine. Do not drink alcohol while taking prescription pain medicine.  Incision care    Follow instructions from your health care provider about how to take care of your incision areas. Make sure you: Keep your incisions clean and dry. Wash your hands with soap and water before and after applying medicine to the areas, and before and after changing your bandage (dressing). If soap and water are not available, use hand sanitizer. Change your dressing as told by your health care provider. Leave stitches (sutures), skin glue, or adhesive strips in  place. These skin closures may need to stay in place for 2 weeks or longer. If adhesive strip edges start to loosen and curl up, you may trim the loose edges. Do not remove adhesive strips completely unless your health care provider tells you to do that. Do not wear tight clothing over the incisions. Tight clothing may rub and irritate the incision areas, which may cause the incisions to open. Do not take baths, swim, or use a hot tub until your health care provider approves. OK TO SHOWER IN 24HRS.   Check your incision area every day for signs of infection. Check for: More redness, swelling, or pain. More fluid or blood. Warmth. Pus or a bad smell. Activity Avoid lifting anything that is heavier than 10 lb (4.5 kg) for 2 weeks or until your health care provider says it is okay. No pushing/pulling greater than 30lbs You may resume normal activities as told by your health care provider. Ask your health care provider what activities are safe for you. Take rest breaks during the day as needed. Eating and drinking Follow instructions from your health care provider about what you can eat after surgery. To prevent or treat constipation while you are taking prescription pain medicine, your health care provider may recommend that you: Drink enough fluid to keep your urine clear or pale yellow. Take over-the-counter or prescription medicines. Eat foods that are high in fiber, such as fresh fruits and vegetables, whole grains, and beans. Limit foods that are high in fat and processed sugars, such as fried and   sweet foods. General instructions Ask your health care provider when you will need an appointment to get your sutures or staples removed. Keep all follow-up visits as told by your health care provider. This is important. Contact a health care provider if: You have more redness, swelling, or pain around your incisions. You have more fluid or blood coming from the incisions. Your incisions feel  warm to the touch. You have pus or a bad smell coming from your incisions or your dressing. You have a fever. You have an incision that breaks open (edges not staying together) after sutures or staples have been removed. Get help right away if: You develop a rash. You have chest pain or difficulty breathing. You have pain or swelling in your legs. You feel light-headed or you faint. Your abdomen swells (becomes distended). You have nausea or vomiting. You have blood in your stool (feces). This information is not intended to replace advice given to you by your health care provider. Make sure you discuss any questions you have with your health care provider. Document Released: 10/02/2004 Document Revised: 12/02/2017 Document Reviewed: 12/15/2015 Elsevier Interactive Patient Education  2019 [employer]      AMBULATORY SURGERY  DISCHARGE INSTRUCTIONS   The drugs that you were given will stay in your system until tomorrow so for the next 24 hours you should not:  Drive an automobile Make any legal decisions Drink any alcoholic beverage   You may resume regular meals tomorrow.  Today it is better to start with liquids and gradually work up to solid foods.  You may eat anything you prefer, but it is better to start with liquids, then soup and crackers, and gradually work up to solid foods.   Please notify your doctor immediately if you have any unusual bleeding, trouble breathing, redness and pain at the surgery site, drainage, fever, or pain not relieved by medication.    Additional Instructions:        Please contact your physician with any problems or Same Day Surgery at [phone], Monday through Friday 6 am to 4 pm, or [facility] Main number at [phone].  

## 2020-10-30 NOTE — Anesthesia Procedure Notes (Signed)
Procedure Name: Intubation Date/Time: 10/30/2020 1:30 PM Performed by: Gayland Curry, CRNA Pre-anesthesia Checklist: Patient identified, Emergency Drugs available, Suction available and Patient being monitored Patient Re-evaluated:Patient Re-evaluated prior to induction Oxygen Delivery Method: Circle system utilized Preoxygenation: Pre-oxygenation with 100% oxygen Induction Type: IV induction Ventilation: Mask ventilation without difficulty Laryngoscope Size: Mac and 4 Grade View: Grade I Tube type: Oral Tube size: 7.0 mm Number of attempts: 1 Placement Confirmation: ETT inserted through vocal cords under direct vision, positive ETCO2 and breath sounds checked- equal and bilateral Secured at: 22 cm Tube secured with: Tape Dental Injury: Teeth and Oropharynx as per pre-operative assessment

## 2020-10-30 NOTE — Anesthesia Preprocedure Evaluation (Addendum)
Anesthesia Evaluation  Patient identified by MRN, date of birth, ID band Patient awake    Reviewed: Allergy & Precautions, NPO status , Patient's Chart, lab work & pertinent test results  History of Anesthesia Complications Negative for: history of anesthetic complications (no family hx)  Airway Mallampati: II  TM Distance: >3 FB Neck ROM: Full    Dental   Poor dentition, many decayed/broken - denies loose teeth:   Pulmonary neg pulmonary ROS,    Pulmonary exam normal        Cardiovascular negative cardio ROS Normal cardiovascular exam     Neuro/Psych Seizures - (remote),  negative psych ROS   GI/Hepatic Neg liver ROS, GERD  Controlled,  Endo/Other  BMI 35  Renal/GU negative Renal ROS  negative genitourinary   Musculoskeletal negative musculoskeletal ROS (+)   Abdominal   Peds  Hematology negative hematology ROS (+)   Anesthesia Other Findings 2020 MAC 4 Grade 1 view  EKG 07/14/20 NSR  Reproductive/Obstetrics                            Anesthesia Physical Anesthesia Plan  ASA: 2  Anesthesia Plan: General   Post-op Pain Management:    Induction:   PONV Risk Score and Plan: 4 or greater  Airway Management Planned: Oral ETT  Additional Equipment:   Intra-op Plan:   Post-operative Plan:   Informed Consent: I have reviewed the patients History and Physical, chart, labs and discussed the procedure including the risks, benefits and alternatives for the proposed anesthesia with the patient or authorized representative who has indicated his/her understanding and acceptance.       Plan Discussed with: CRNA  Anesthesia Plan Comments: (CHART REVIEW ONLY - PATIENT NEEDS FORMAL EVALUATION PRIOR TO PROCEEDING  )       Anesthesia Quick Evaluation

## 2020-10-30 NOTE — Transfer of Care (Signed)
Immediate Anesthesia Transfer of Care Note  Patient: Kristopher Allen  Procedure(s) Performed: HERNIA REPAIR INCISIONAL (Abdomen) INSERTION OF MESH (Abdomen)  Patient Location: PACU  Anesthesia Type:General  Level of Consciousness: drowsy and patient cooperative  Airway & Oxygen Therapy: Patient Spontanous Breathing and Patient connected to face mask oxygen  Post-op Assessment: Report given to RN  Post vital signs: Reviewed and stable  Last Vitals:  Vitals Value Taken Time  BP 126/87 10/30/20 1443  Temp    Pulse 88 10/30/20 1448  Resp 20 10/30/20 1448  SpO2 98 % 10/30/20 1448  Vitals shown include unvalidated device data.  Last Pain:  Vitals:   10/30/20 1304  TempSrc: Temporal  PainSc: 0-No pain         Complications: No notable events documented.

## 2020-10-30 NOTE — Interval H&P Note (Signed)
No change 

## 2020-10-31 ENCOUNTER — Encounter: Payer: Self-pay | Admitting: Surgery

## 2020-12-04 ENCOUNTER — Other Ambulatory Visit (HOSPITAL_COMMUNITY): Payer: Self-pay | Admitting: Surgery

## 2020-12-08 ENCOUNTER — Other Ambulatory Visit: Payer: Self-pay | Admitting: Surgery

## 2020-12-08 DIAGNOSIS — R1906 Epigastric swelling, mass or lump: Secondary | ICD-10-CM

## 2020-12-09 ENCOUNTER — Other Ambulatory Visit: Payer: Self-pay

## 2020-12-09 ENCOUNTER — Ambulatory Visit
Admission: RE | Admit: 2020-12-09 | Discharge: 2020-12-09 | Disposition: A | Discharge status: Home / Self Care | Payer: BC Managed Care – PPO | Source: Ambulatory Visit | Attending: Surgery | Admitting: Surgery

## 2020-12-09 DIAGNOSIS — R1906 Epigastric swelling, mass or lump: Secondary | ICD-10-CM | POA: Insufficient documentation

## 2020-12-10 ENCOUNTER — Other Ambulatory Visit: Payer: Self-pay

## 2020-12-10 ENCOUNTER — Emergency Department: Payer: BC Managed Care – PPO

## 2020-12-10 ENCOUNTER — Emergency Department
Admission: EM | Admit: 2020-12-10 | Discharge: 2020-12-10 | Disposition: A | Discharge status: Home / Self Care | Payer: BC Managed Care – PPO | Attending: Emergency Medicine | Admitting: Emergency Medicine

## 2020-12-10 DIAGNOSIS — R002 Palpitations: Secondary | ICD-10-CM | POA: Insufficient documentation

## 2020-12-10 DIAGNOSIS — R0789 Other chest pain: Secondary | ICD-10-CM | POA: Diagnosis present

## 2020-12-10 DIAGNOSIS — Z20822 Contact with and (suspected) exposure to covid-19: Secondary | ICD-10-CM | POA: Diagnosis not present

## 2020-12-10 DIAGNOSIS — R079 Chest pain, unspecified: Secondary | ICD-10-CM

## 2020-12-10 LAB — CBC WITH DIFFERENTIAL/PLATELET
Basophils Absolute: 0 10*3/uL (ref 0.0–0.1)
Basophils Relative: 0 %
Eosinophils Absolute: 0 10*3/uL (ref 0.0–0.5)
Eosinophils Relative: 1 %
HCT: 43.4 % (ref 39.0–52.0)
Hemoglobin: 16 g/dL (ref 13.0–17.0)
Lymphocytes Relative: 19 %
Lymphs Abs: 1.6 10*3/uL (ref 0.7–4.0)
MCH: 32.4 pg (ref 26.0–34.0)
MCHC: 36.9 g/dL — ABNORMAL HIGH (ref 30.0–36.0)
MCV: 87.9 fL (ref 80.0–100.0)
Monocytes Absolute: 0.5 10*3/uL (ref 0.1–1.0)
Monocytes Relative: 6 %
Neutro Abs: 6.1 10*3/uL (ref 1.7–7.7)
Neutrophils Relative %: 74 %
Platelets: 270 10*3/uL (ref 150–400)
RBC: 4.94 MIL/uL (ref 4.22–5.81)
RDW: 12.9 % (ref 11.5–15.5)
WBC: 8.3 10*3/uL (ref 4.0–10.5)
nRBC: 0 % (ref 0.0–0.2)

## 2020-12-10 LAB — HEPATIC FUNCTION PANEL
ALT: 108 U/L — ABNORMAL HIGH (ref 0–44)
AST: 57 U/L — ABNORMAL HIGH (ref 15–41)
Albumin: 4 g/dL (ref 3.5–5.0)
Alkaline Phosphatase: 73 U/L (ref 38–126)
Bilirubin, Direct: 0.2 mg/dL (ref 0.0–0.2)
Indirect Bilirubin: 0.7 mg/dL (ref 0.3–0.9)
Total Bilirubin: 0.9 mg/dL (ref 0.3–1.2)
Total Protein: 7.6 g/dL (ref 6.5–8.1)

## 2020-12-10 LAB — BASIC METABOLIC PANEL WITH GFR
Anion gap: 10 (ref 5–15)
BUN: 12 mg/dL (ref 6–20)
CO2: 23 mmol/L (ref 22–32)
Calcium: 9 mg/dL (ref 8.9–10.3)
Chloride: 107 mmol/L (ref 98–111)
Creatinine, Ser: 0.92 mg/dL (ref 0.61–1.24)
GFR, Estimated: >60 mL/min
Glucose, Bld: 101 mg/dL — ABNORMAL HIGH (ref 70–99)
Potassium: 4.2 mmol/L (ref 3.5–5.1)
Sodium: 140 mmol/L (ref 135–145)

## 2020-12-10 LAB — RESP PANEL BY RT-PCR (FLU A&B, COVID) ARPGX2
Influenza A by PCR: NEGATIVE
Influenza B by PCR: NEGATIVE
SARS Coronavirus 2 by RT PCR: NEGATIVE

## 2020-12-10 LAB — CBC
HCT: 43.2 % (ref 39.0–52.0)
Hemoglobin: 15.9 g/dL (ref 13.0–17.0)
MCH: 32.3 pg (ref 26.0–34.0)
MCHC: 36.8 g/dL — ABNORMAL HIGH (ref 30.0–36.0)
MCV: 87.8 fL (ref 80.0–100.0)
Platelets: 272 10*3/uL (ref 150–400)
RBC: 4.92 MIL/uL (ref 4.22–5.81)
RDW: 13 % (ref 11.5–15.5)
WBC: 8.3 10*3/uL (ref 4.0–10.5)
nRBC: 0 % (ref 0.0–0.2)

## 2020-12-10 LAB — TROPONIN I (HIGH SENSITIVITY)
Troponin I (High Sensitivity): 3 ng/L (ref <18)
Troponin I (High Sensitivity): 2 ng/L (ref <18)

## 2020-12-10 NOTE — Discharge Instructions (Addendum)
Please follow-up with your cardiologist.  All the test we have done here today are negative.  There are some other things he could do in the office if he feels it is indicated.  Have your surgeon keep an eye on your fluid collection.  I am sure he will do that anyway since he ordered the ultrasound.  Please return for any increasing chest pain or shortness of breath.

## 2020-12-10 NOTE — ED Triage Notes (Signed)
Pt to ED for chest discomfort that started last night with palpitations. Denies shob, n,v

## 2020-12-10 NOTE — ED Provider Notes (Signed)
[facility] Emergency Department Provider Note   ____________________________________________   Event Date/Time   First MD Initiated Contact with Patient 12/10/20 [phone]     (approximate)  I have reviewed the triage vital signs and the nursing notes.   HISTORY  Chief Complaint chest discomfort   HPI Kristopher Allen is a 35 y.o. male who comes in complaining of chest discomfort starting last night with palpitations.  He did not have any shortness of breath or nausea or vomiting.  He does not have a cough.  He explains that he had an ultrasound done yesterday.  This was to check on a swelling he had over the area of hernia repair.  The ultrasound report is below basically so there is a complex fluid mass in the area of the hernia repair.  Patient says he could not sleep last night because he heard the fluid moving around and his heart was beating funny and so he came in this morning.  He really did not have any chest discomfort.  There was no radiation. Patient reports he has some blood coming out of his nose and his nose feel dry and he wants a COVID test.    Past Medical History:  Diagnosis Date   GERD (gastroesophageal reflux disease)    Seizures (HCC)    states when he was younger   Ventral hernia     Patient Active Problem List   Diagnosis Date Noted   Acute cholecystitis 11/07/2018    Past Surgical History:  Procedure Laterality Date   APPENDECTOMY     CHOLECYSTECTOMY N/A 11/09/2018   Procedure: LAPAROSCOPIC CHOLECYSTECTOMY;  Surgeon: Sung Amabile, DO;  Location: [facility] ORS;  Service: General;  Laterality: N/A;   INCISIONAL HERNIA REPAIR N/A 10/30/2020   Procedure: HERNIA REPAIR INCISIONAL;  Surgeon: Sung Amabile, DO;  Location: [facility] ORS;  Service: General;  Laterality: N/A;  small hernia   INSERTION OF MESH N/A 10/30/2020   Procedure: INSERTION OF MESH;  Surgeon: Sung Amabile, DO;  Location: [facility] ORS;  Service: General;  Laterality: N/A;     Prior to Admission medications   Medication Sig Start Date End Date Taking? Authorizing Provider  HYDROcodone-acetaminophen (NORCO) 5-325 MG tablet Take 1 tablet by mouth every 6 (six) hours as needed for up to 6 doses for moderate pain. 10/30/20   Tonna Boehringer, Isami, DO  ibuprofen (ADVIL) 800 MG tablet Take 1 tablet (800 mg total) by mouth every 8 (eight) hours as needed for mild pain or moderate pain. 10/30/20   Sung Amabile, DO    Allergies Patient has no known allergies.  No family history on file.  Social History Social History   Tobacco Use   Smoking status: Never   Smokeless tobacco: Never  Vaping Use   Vaping Use: Never used  Substance Use Topics   Alcohol use: Never   Drug use: Never    Review of Systems  Constitutional: No fever/chills Eyes: No visual changes. ENT: No sore throat. Cardiovascular: chest pain? Respiratory: Denies shortness of breath. Gastrointestinal: No abdominal pain.  No nausea, no vomiting.  No diarrhea.  No constipation. Genitourinary: Negative for dysuria. Musculoskeletal: Negative for back pain. Skin: Negative for rash. Neurological: Negative for headaches, focal weakness   ____________________________________________   PHYSICAL EXAM:  VITAL SIGNS: ED Triage Vitals  Enc Vitals Group     BP 12/10/20 0748 (!) 145/80     Pulse Rate 12/10/20 0739 97     Resp 12/10/20 0739 18  Temp 12/10/20 0739 98.4 F (36.9 C)     Temp Source 12/10/20 0739 Oral     SpO2 12/10/20 0739 100 %     Weight 12/10/20 0737 250 lb (113.4 kg)     Height 12/10/20 0737 6' (1.829 m)     Head Circumference --      Peak Flow --      Pain Score 12/10/20 0737 4     Pain Loc --      Pain Edu? --      Excl. in GC? --    Constitutional: Alert and oriented. Well appearing and in no acute distress. Eyes: Conjunctivae are normal. PER. EOMI. Head: Atraumatic. Nose: Normal no dry crusty blood no discharge Mouth/Throat: Mucous membranes are moist.  Oropharynx  non-erythematous. Neck: No stridor.  Cardiovascular: Normal rate, regular rhythm. Grossly normal heart sounds.  Good peripheral circulation. Respiratory: Normal respiratory effort.  No retractions. Lungs CTAB. Gastrointestinal: Soft and nontender.  No surgical scar that is healed but still red just under the epigastrium no distention. No abdominal bruits.  Musculoskeletal: No lower extremity tenderness nor edema.   Neurologic:  Normal speech and language. No gross focal neurologic deficits are appreciated.  Skin:  Skin is warm, dry and intact. No rash noted.   ____________________________________________   LABS (all labs ordered are listed, but only abnormal results are displayed)  Labs Reviewed  RESP PANEL BY RT-PCR (FLU A&B, COVID) [id number]  BASIC METABOLIC PANEL  CBC  TROPONIN I (HIGH SENSITIVITY)   ____________________________________________  EKG  EKG read interpreted by me shows normal sinus rhythm rate of 94 normal axis no acute disease ____________________________________________  RADIOLOGY Jill Poling, personally viewed and evaluated these images (plain radiographs) as part of my medical decision making, as well as reviewing the written report by the radiologist.  ED MD interpretation:    Official radiology report(s): US Abdomen Limited  Result Date: 12/09/2020 CLINICAL DATA:  Abdominal wall lump, status post hernia repair. EXAM: ULTRASOUND ABDOMEN LIMITED COMPARISON:  None. FINDINGS: A 4.1 cm x 2.0 cm x 5.5 cm complex appearing predominantly hypoechoic area is seen within the anterior abdominal wall soft tissues overlying the site of prior hernia repair. No abnormal flow is seen within this region on color Doppler evaluation. There is no evidence of associated hernia. IMPRESSION: Complex fluid collection within the soft tissues of the anterior abdominal wall at the site of prior hernia repair. Electronically Signed   By: Aram Candela M.D.   On: 12/09/2020  22:29    ____________________________________________   PROCEDURES  Procedure(s) performed (including Critical Care):  Procedures   ____________________________________________   INITIAL IMPRESSION / ASSESSMENT AND PLAN / ED COURSE  Patient's labs and x-ray showed no sign of cardiac pathology.  I cannot find anything else going on with him.  Surgery ordered ultrasound and will follow up on it.  Patient will return for any further problems.  He has a cardiologist who has seen him previously.  I will have him follow-up with the cardiologist just in case.              ____________________________________________   FINAL CLINICAL IMPRESSION(S) / ED DIAGNOSES  Final diagnoses:  Chest pain     ED Discharge Orders     None        Note:  This document was prepared using Dragon voice recognition software and may include unintentional dictation errors.    Arnaldo Natal, MD 12/10/20 [phone]

## 2021-02-23 ENCOUNTER — Emergency Department
Admission: EM | Admit: 2021-02-23 | Discharge: 2021-02-23 | Disposition: A | Discharge status: Home / Self Care | Payer: BC Managed Care – PPO | Attending: Emergency Medicine | Admitting: Emergency Medicine

## 2021-02-23 ENCOUNTER — Other Ambulatory Visit: Payer: Self-pay

## 2021-02-23 DIAGNOSIS — S0101XA Laceration without foreign body of scalp, initial encounter: Secondary | ICD-10-CM | POA: Diagnosis not present

## 2021-02-23 DIAGNOSIS — S0990XA Unspecified injury of head, initial encounter: Secondary | ICD-10-CM | POA: Diagnosis present

## 2021-02-23 DIAGNOSIS — Y990 Civilian activity done for income or pay: Secondary | ICD-10-CM | POA: Diagnosis not present

## 2021-02-23 DIAGNOSIS — W208XXA Other cause of strike by thrown, projected or falling object, initial encounter: Secondary | ICD-10-CM | POA: Insufficient documentation

## 2021-02-23 NOTE — ED Triage Notes (Addendum)
Pt states tonight at work the arm of one of the machines hit him on the top of the head. Pt has a small abrasion to top of head, no bleeding noted. No loc

## 2021-02-23 NOTE — ED Provider Notes (Signed)
[facility] Emergency Department Provider Note   ____________________________________________   Event Date/Time   First MD Initiated Contact with Patient 02/23/21 2027     (approximate)  I have reviewed the triage vital signs and the nursing notes.   HISTORY  Chief Complaint Head Laceration    HPI REDFORD BEHRLE is a 35 y.o. male with past medical history of GERD and remote seizures who presents to the ED complaining of scalp laceration.  Patient reports that just prior to arrival he was at work when the mechanical arm of the machine came down on the top of his head.  He noticed some bleeding right away but denies losing consciousness.  He states he was able to stop the bleeding with pressure and rinse out the wound at work.  He denies any ongoing headache and has not had any vision changes, speech changes, numbness, or weakness.  He does not take any blood thinners.        Past Medical History:  Diagnosis Date   GERD (gastroesophageal reflux disease)    Seizures (HCC)    states when he was younger   Ventral hernia     Patient Active Problem List   Diagnosis Date Noted   Acute cholecystitis 11/07/2018    Past Surgical History:  Procedure Laterality Date   APPENDECTOMY     CHOLECYSTECTOMY N/A 11/09/2018   Procedure: LAPAROSCOPIC CHOLECYSTECTOMY;  Surgeon: Sung Amabile, DO;  Location: [facility] ORS;  Service: General;  Laterality: N/A;   INCISIONAL HERNIA REPAIR N/A 10/30/2020   Procedure: HERNIA REPAIR INCISIONAL;  Surgeon: Sung Amabile, DO;  Location: [facility] ORS;  Service: General;  Laterality: N/A;  small hernia   INSERTION OF MESH N/A 10/30/2020   Procedure: INSERTION OF MESH;  Surgeon: Sung Amabile, DO;  Location: [facility] ORS;  Service: General;  Laterality: N/A;    Prior to Admission medications   Medication Sig Start Date End Date Taking? Authorizing Provider  HYDROcodone-acetaminophen (NORCO) 5-325 MG tablet Take 1 tablet by mouth every 6 (six)  hours as needed for up to 6 doses for moderate pain. 10/30/20   Tonna Boehringer, Isami, DO  ibuprofen (ADVIL) 800 MG tablet Take 1 tablet (800 mg total) by mouth every 8 (eight) hours as needed for mild pain or moderate pain. 10/30/20   Sung Amabile, DO    Allergies Patient has no known allergies.  No family history on file.  Social History Social History   Tobacco Use   Smoking status: Never   Smokeless tobacco: Never  Vaping Use   Vaping Use: Never used  Substance Use Topics   Alcohol use: Never   Drug use: Never    Review of Systems  Constitutional: No fever/chills Eyes: No visual changes. ENT: No sore throat. Cardiovascular: Denies chest pain. Respiratory: Denies shortness of breath. Gastrointestinal: No abdominal pain.  No nausea, no vomiting.  No diarrhea.  No constipation. Genitourinary: Negative for dysuria. Musculoskeletal: Negative for back pain. Skin: Negative for rash.  Positive for scalp laceration. Neurological: Negative for headaches, focal weakness or numbness.  ____________________________________________   PHYSICAL EXAM:  VITAL SIGNS: ED Triage Vitals  Enc Vitals Group     BP 02/23/21 2019 128/77     Pulse Rate 02/23/21 2019 84     Resp 02/23/21 2019 17     Temp 02/23/21 2019 98.5 F (36.9 C)     Temp Source 02/23/21 2019 Oral     SpO2 02/23/21 2019 97 %     Weight --  Height --      Head Circumference --      Peak Flow --      Pain Score 02/23/21 2018 0     Pain Loc --      Pain Edu? --      Excl. in GC? --     Constitutional: Alert and oriented. Eyes: Conjunctivae are normal. Head: Less than 1 cm superficial laceration to frontal scalp.  No active bleeding noted, no hematomas or step-offs noted. Nose: No congestion/rhinnorhea. Mouth/Throat: Mucous membranes are moist. Neck: Normal ROM Cardiovascular: Normal rate, regular rhythm. Grossly normal heart sounds. Respiratory: Normal respiratory effort.  No retractions. Lungs  CTAB. Gastrointestinal: Soft and nontender. No distention. Genitourinary: deferred Musculoskeletal: No lower extremity tenderness nor edema. Neurologic:  Normal speech and language. No gross focal neurologic deficits are appreciated. Skin:  Skin is warm, dry and intact. No rash noted. Psychiatric: Mood and affect are normal. Speech and behavior are normal.  ____________________________________________   LABS (all labs ordered are listed, but only abnormal results are displayed)  Labs Reviewed - No data to display   PROCEDURES  Procedure(s) performed (including Critical Care):  Procedures   ____________________________________________   INITIAL IMPRESSION / ASSESSMENT AND PLAN / ED COURSE      35 year old male with past medical history of seizures and GERD who presents to the ED after being struck on the head with a mechanical arm, resulting in a laceration.  Laceration appears superficial and does not require suturing, no evidence of significant traumatic injury to head or neck.  Patient is appropriate for discharge home and was counseled to keep wound clean, follow-up with PCP and to return to the ED for new worsening symptoms.  Patient agrees with plan.      ____________________________________________   FINAL CLINICAL IMPRESSION(S) / ED DIAGNOSES  Final diagnoses:  Laceration of scalp, initial encounter     ED Discharge Orders     None        Note:  This document was prepared using Dragon voice recognition software and may include unintentional dictation errors.    Chesley Noon, MD 02/23/21 2030

## 2021-10-19 IMAGING — DX DG CHEST 1V PORT
1 series · 1 of 1 positions shown · non-contrast
Comparison: CT 06/14/2020

CLINICAL DATA: Palpitations

EXAM:
PORTABLE CHEST 1 VIEW

[chest ap]
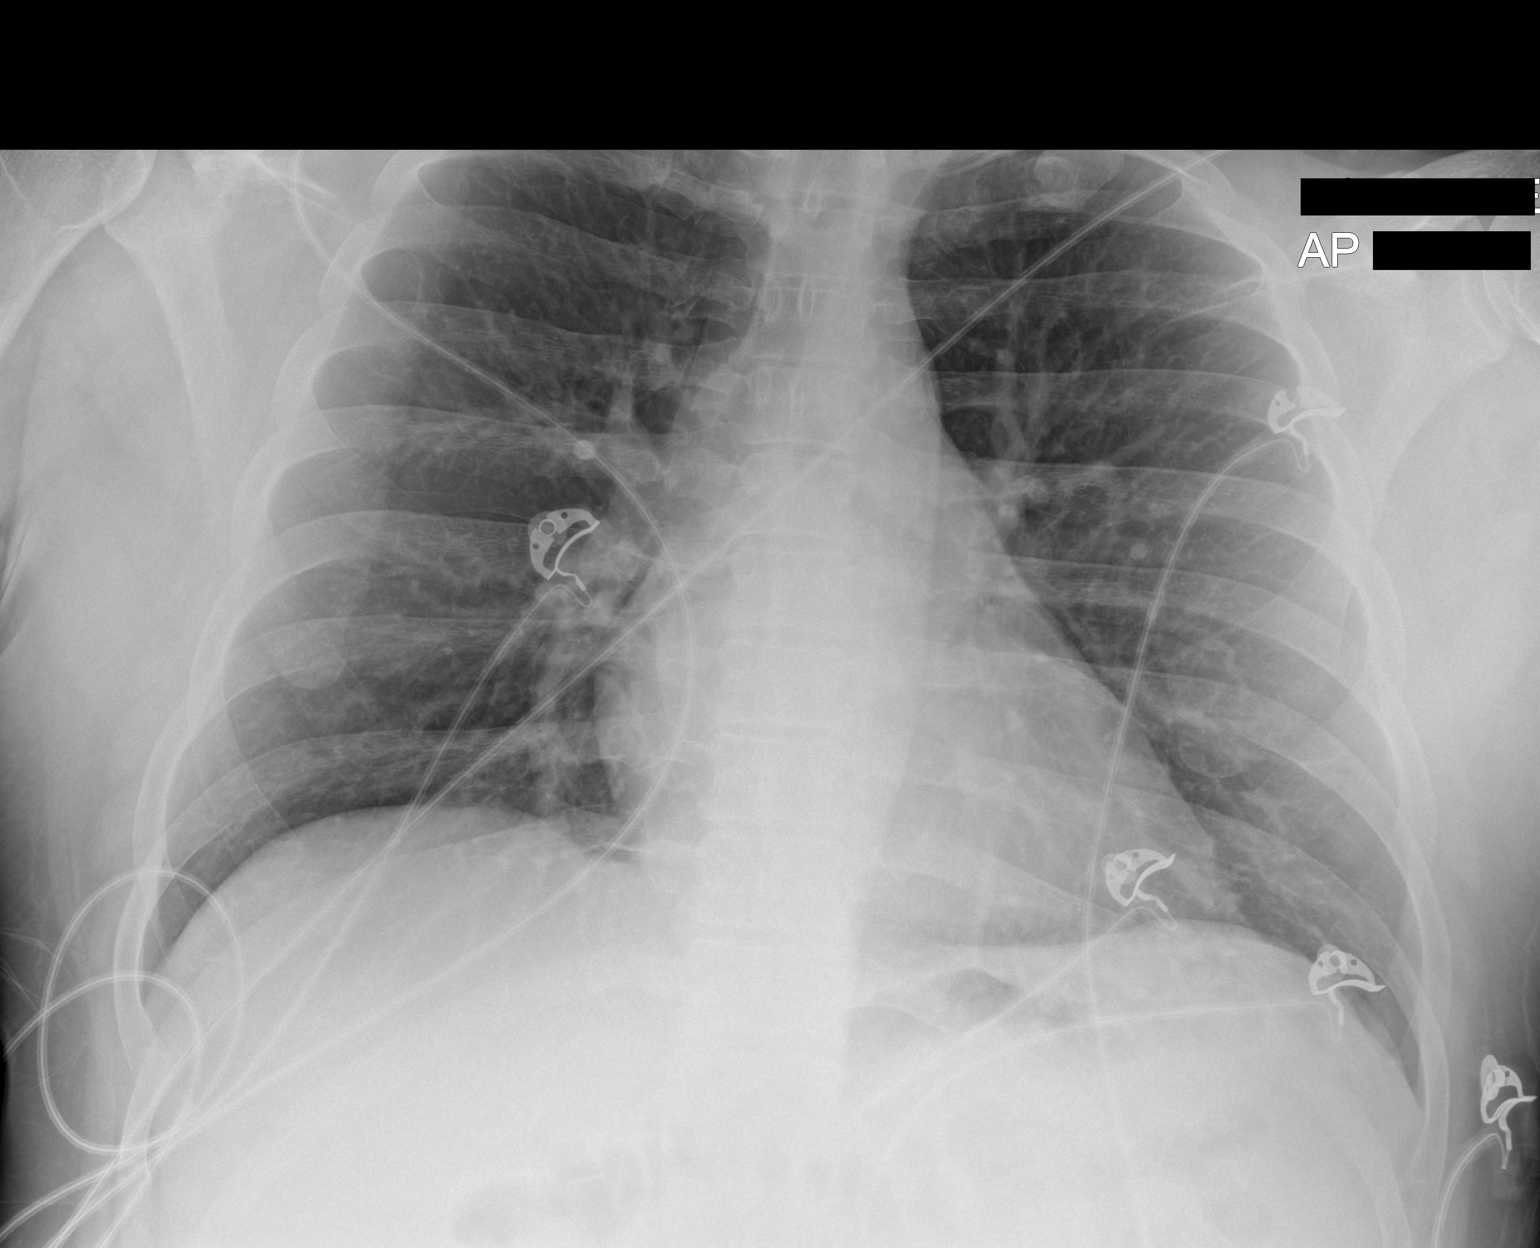

[1 of 1 positions shown; findings below may reference images not displayed]

FINDINGS: The heart size and mediastinal contours are within normal limits.
Both lungs are clear. The visualized skeletal structures are
unremarkable.
IMPRESSION: No active disease.

## 2022-03-23 IMAGING — US US ABDOMEN LIMITED
1 series · 8 of 8 positions shown · non-contrast
Comparison: None.

CLINICAL DATA: Abdominal wall lump, status post hernia repair.

EXAM:
ULTRASOUND ABDOMEN LIMITED

[Series 1: us abdomen complete · 8 acquisitions, 8 frames shown]
[im 1/8]
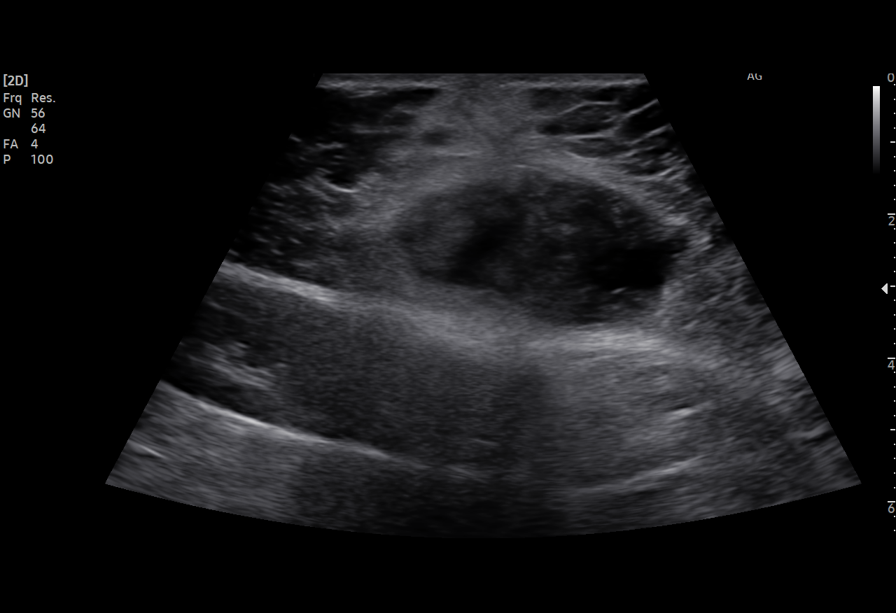
[im 2/8]
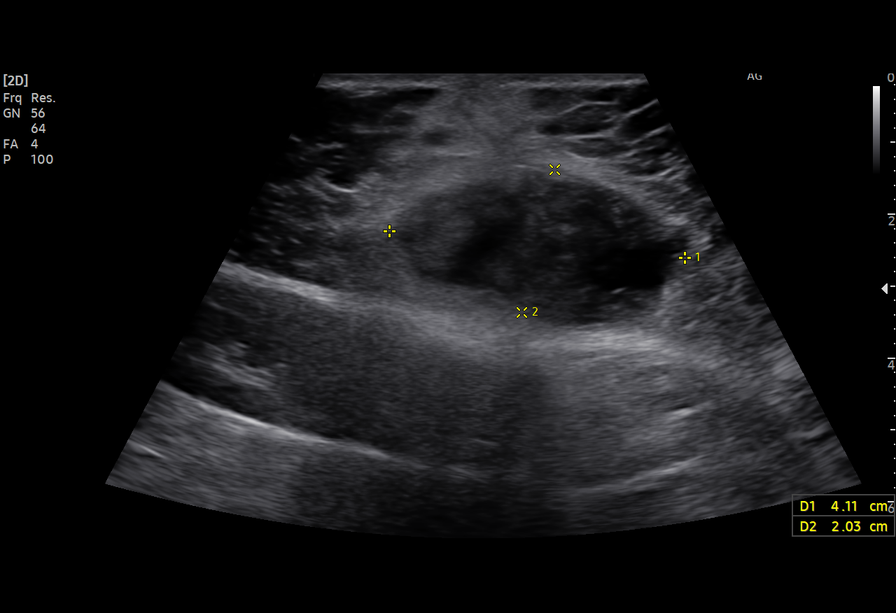
[im 3/8]
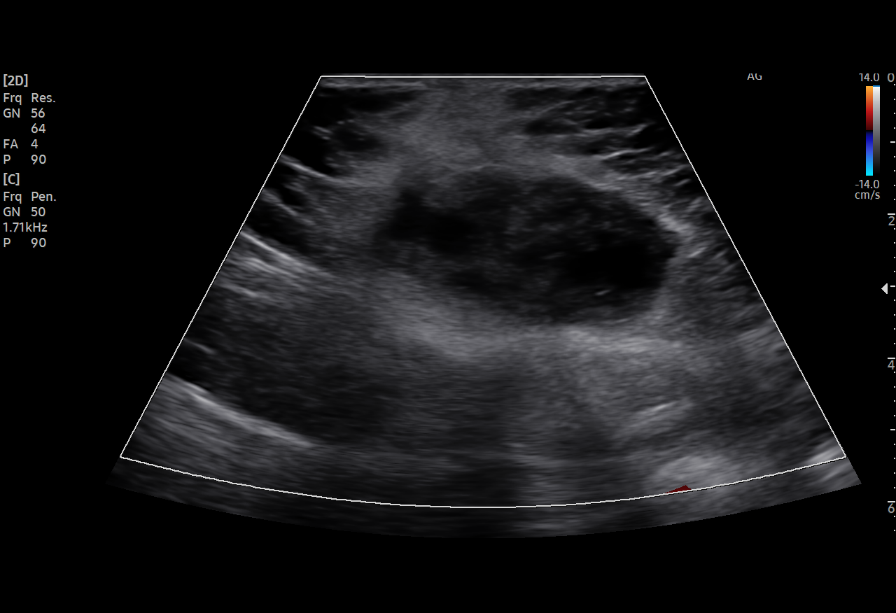
[im 4/8]
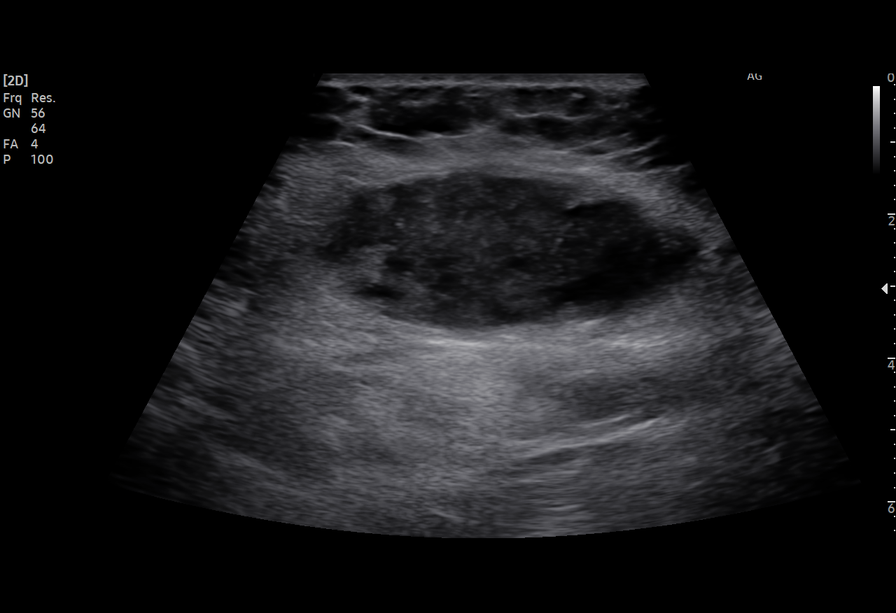
[im 5/8]
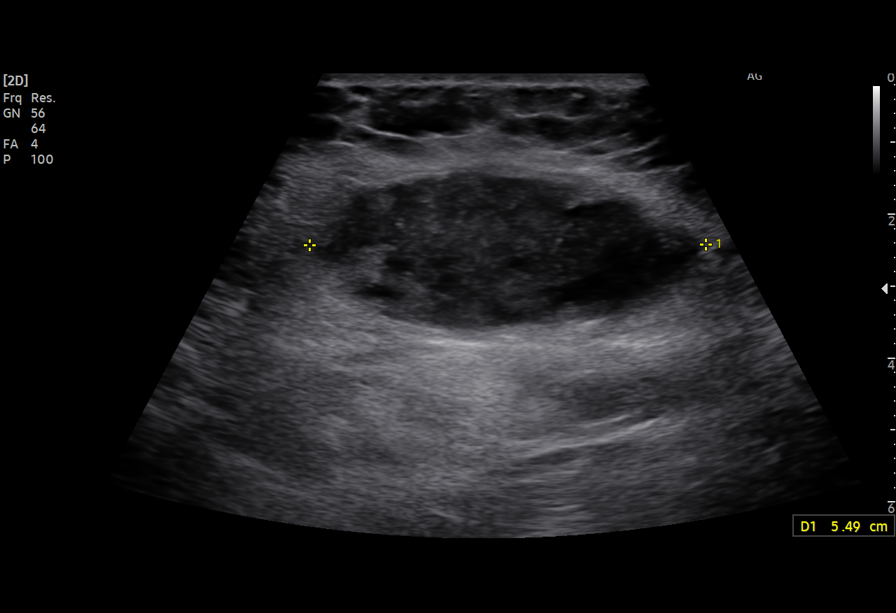
[im 6/8]
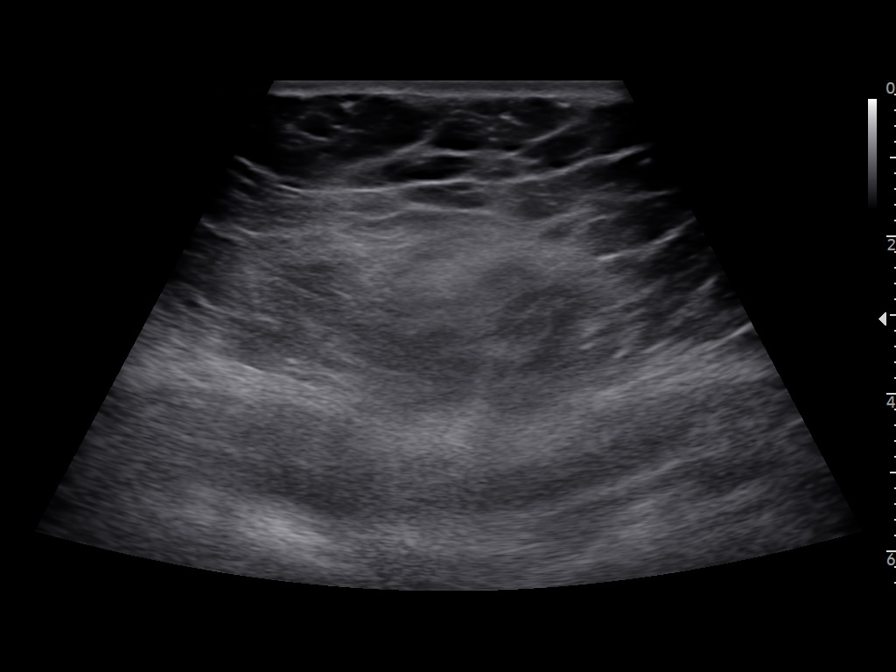
[im 7/8]
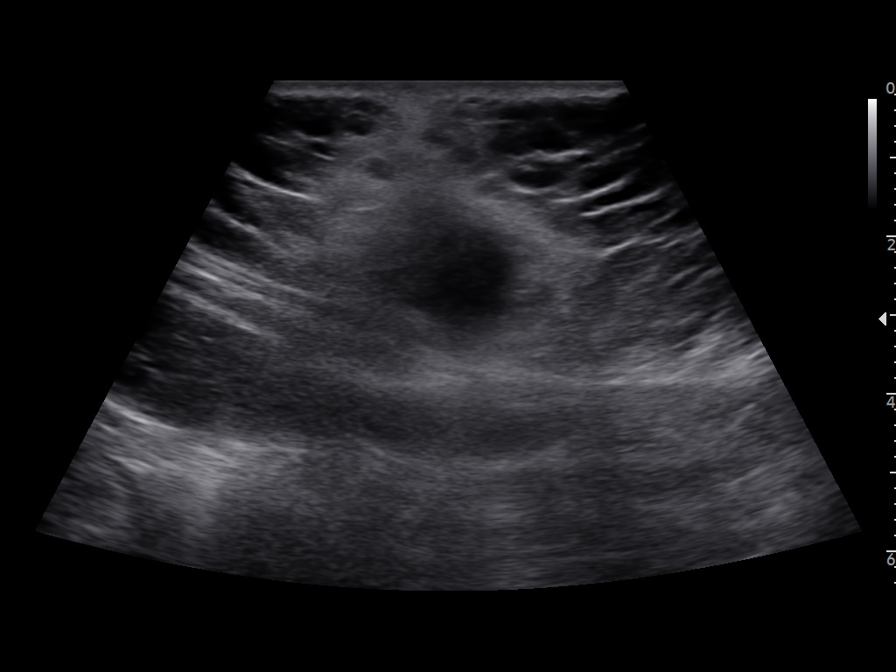
[im 8/8]
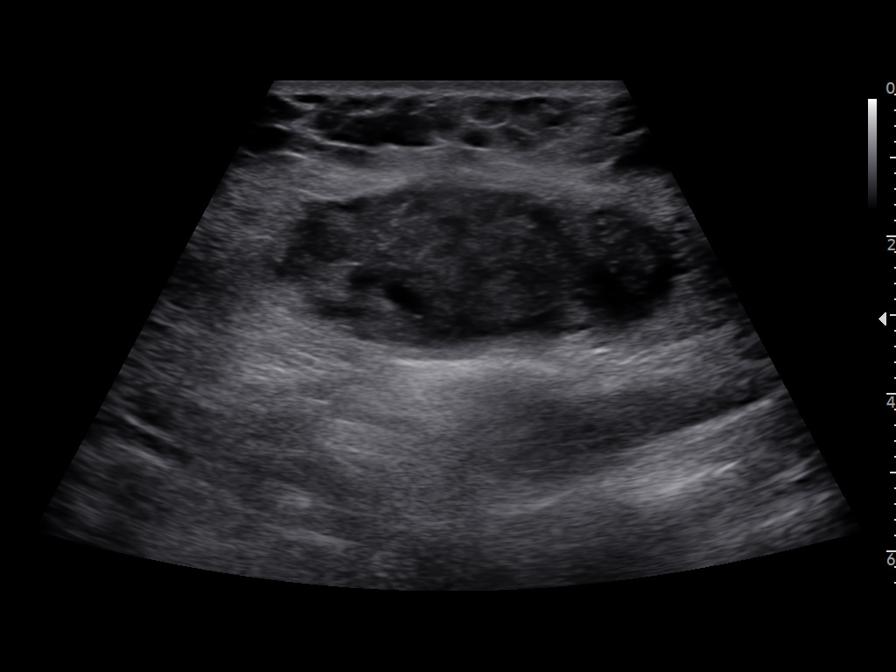

[8 of 8 positions shown; findings below may reference images not displayed]

FINDINGS: A 4.1 cm x 2.0 cm x 5.5 cm complex appearing predominantly
hypoechoic area is seen within the anterior abdominal wall soft
tissues overlying the site of prior hernia repair. No abnormal flow
is seen within this region on color Doppler evaluation.

There is no evidence of associated hernia.
IMPRESSION: Complex fluid collection within the soft tissues of the anterior
abdominal wall at the site of prior hernia repair.

## 2022-03-24 IMAGING — DX DG CHEST 1V PORT
1 series · 2 of 2 positions shown · non-contrast
Comparison: Chest radiograph, 07/08/2018.  CT chest, 06/14/2020.

CLINICAL DATA: Chest discomfort.  Palpitations.

EXAM:
PORTABLE CHEST 1 VIEW

[Series 1: chest ap · 0.14mm/px · 2 of 2 slices shown]
[im 1/2]
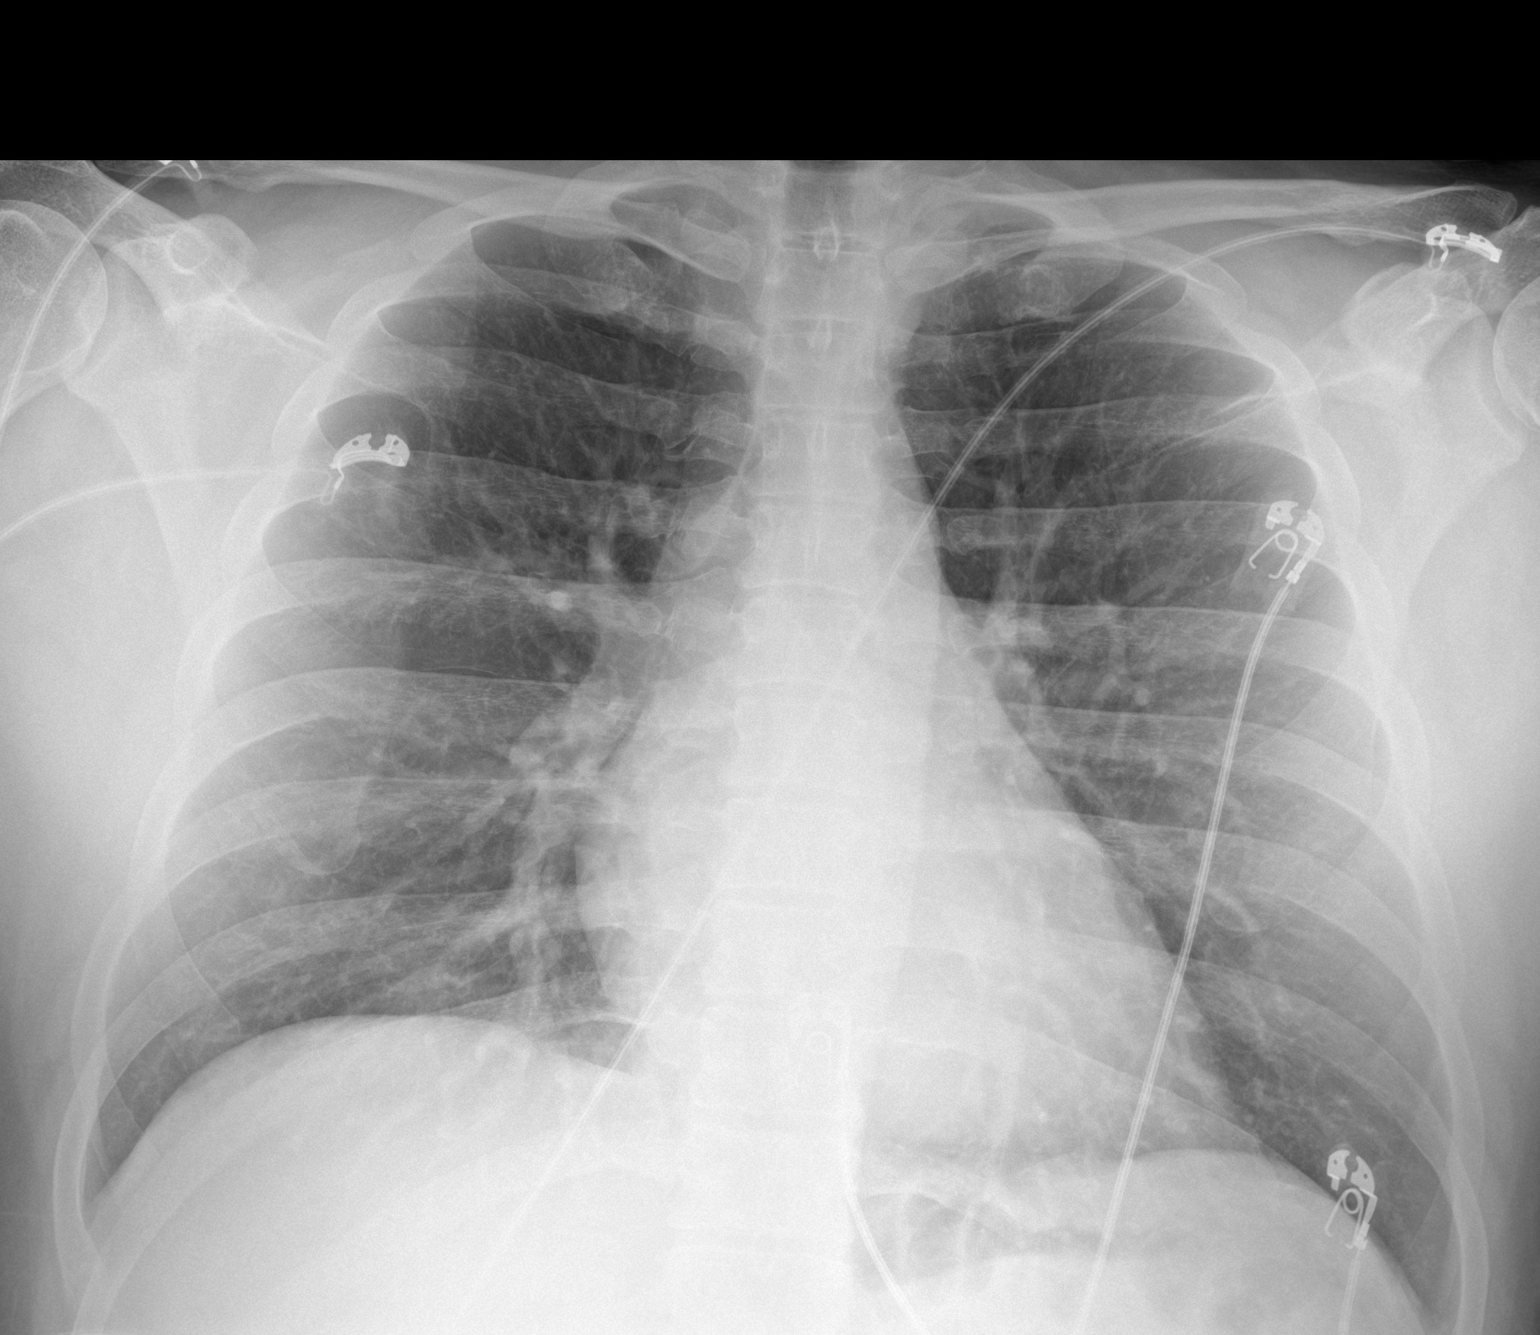
[im 2/2]
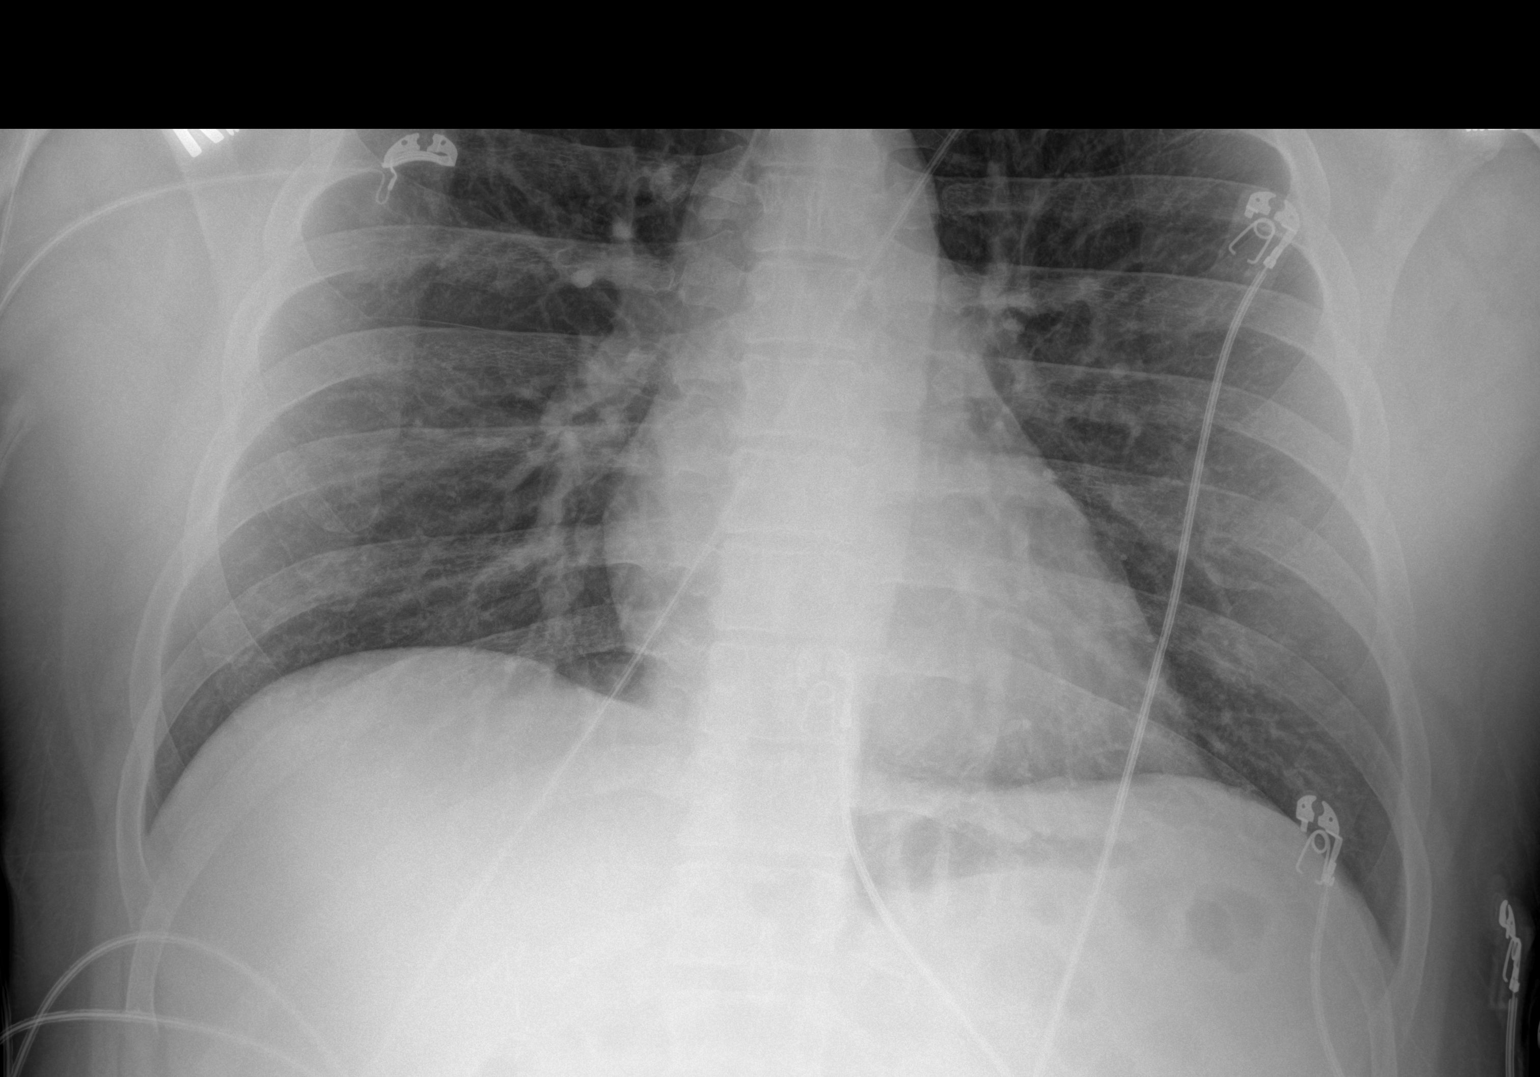

[2 of 2 positions shown; findings below may reference images not displayed]

FINDINGS: Cardiomediastinal silhouette is within normal limits. The lungs are
well inflated. No focal consolidation or mass. No pleural effusion
or pneumothorax. Cholecystectomy clips. No acute osseous
abnormality.
IMPRESSION: Normal chest.
